# Patient Record
Sex: Male | Born: 1975 | Race: Black or African American | Hispanic: No | Smoking: Former smoker
Health system: Southern US, Community
[De-identification: ages and names within clinical notes are randomized; demographics above are authoritative.]

## PROBLEM LIST (undated history)

## (undated) DIAGNOSIS — I10 Essential (primary) hypertension: Secondary | ICD-10-CM

## (undated) DIAGNOSIS — F32A Depression, unspecified: Secondary | ICD-10-CM

## (undated) DIAGNOSIS — R454 Irritability and anger: Secondary | ICD-10-CM

## (undated) DIAGNOSIS — R4689 Other symptoms and signs involving appearance and behavior: Secondary | ICD-10-CM

## (undated) DIAGNOSIS — F4310 Post-traumatic stress disorder, unspecified: Secondary | ICD-10-CM

## (undated) DIAGNOSIS — R4589 Other symptoms and signs involving emotional state: Secondary | ICD-10-CM

## (undated) DIAGNOSIS — F329 Major depressive disorder, single episode, unspecified: Secondary | ICD-10-CM

## (undated) HISTORY — PX: EYE SURGERY: SHX253

---

## 2009-04-13 ENCOUNTER — Emergency Department (HOSPITAL_COMMUNITY): Admission: EM | Admit: 2009-04-13 | Discharge: 2009-04-13 | Payer: Self-pay | Admitting: Family Medicine

## 2011-05-26 ENCOUNTER — Emergency Department (HOSPITAL_COMMUNITY)
Admission: EM | Admit: 2011-05-26 | Discharge: 2011-05-27 | Disposition: A | Discharge status: Home / Self Care | Payer: Self-pay | Attending: Emergency Medicine | Admitting: Emergency Medicine

## 2011-05-26 DIAGNOSIS — M545 Low back pain, unspecified: Secondary | ICD-10-CM | POA: Insufficient documentation

## 2011-05-26 DIAGNOSIS — W1789XA Other fall from one level to another, initial encounter: Secondary | ICD-10-CM | POA: Insufficient documentation

## 2011-05-26 DIAGNOSIS — S335XXA Sprain of ligaments of lumbar spine, initial encounter: Secondary | ICD-10-CM | POA: Insufficient documentation

## 2011-05-26 DIAGNOSIS — R209 Unspecified disturbances of skin sensation: Secondary | ICD-10-CM | POA: Insufficient documentation

## 2011-05-27 ENCOUNTER — Emergency Department (HOSPITAL_COMMUNITY): Payer: Self-pay

## 2015-02-28 ENCOUNTER — Encounter (HOSPITAL_COMMUNITY): Payer: Self-pay | Admitting: *Deleted

## 2015-02-28 ENCOUNTER — Emergency Department (HOSPITAL_COMMUNITY)
Admission: EM | Admit: 2015-02-28 | Discharge: 2015-02-28 | Disposition: A | Discharge status: Home / Self Care | Payer: Self-pay | Attending: Emergency Medicine | Admitting: Emergency Medicine

## 2015-02-28 DIAGNOSIS — K0889 Other specified disorders of teeth and supporting structures: Secondary | ICD-10-CM

## 2015-02-28 DIAGNOSIS — K088 Other specified disorders of teeth and supporting structures: Secondary | ICD-10-CM | POA: Insufficient documentation

## 2015-02-28 DIAGNOSIS — R21 Rash and other nonspecific skin eruption: Secondary | ICD-10-CM | POA: Insufficient documentation

## 2015-02-28 DIAGNOSIS — Z720 Tobacco use: Secondary | ICD-10-CM | POA: Insufficient documentation

## 2015-02-28 DIAGNOSIS — I10 Essential (primary) hypertension: Secondary | ICD-10-CM | POA: Insufficient documentation

## 2015-02-28 DIAGNOSIS — K029 Dental caries, unspecified: Secondary | ICD-10-CM | POA: Insufficient documentation

## 2015-02-28 MED ORDER — ONDANSETRON 4 MG PO TBDP
4.0000 mg | ORAL_TABLET | Freq: Once | ORAL | Status: AC
  Administered 2015-02-28: 4 mg via ORAL
  Filled 2015-02-28: qty 1

## 2015-02-28 MED ORDER — AMOXICILLIN 500 MG PO CAPS
500.0000 mg | ORAL_CAPSULE | Freq: Once | ORAL | Status: AC
  Administered 2015-02-28: 500 mg via ORAL
  Filled 2015-02-28: qty 1

## 2015-02-28 MED ORDER — AMOXICILLIN 500 MG PO CAPS: 500.0000 mg | ORAL_CAPSULE | Freq: Three times a day (TID) | ORAL | Status: DC

## 2015-02-28 MED ORDER — OXYCODONE-ACETAMINOPHEN 5-325 MG PO TABS
1.0000 | ORAL_TABLET | Freq: Once | ORAL | Status: AC
  Administered 2015-02-28: 1 via ORAL
  Filled 2015-02-28: qty 1

## 2015-02-28 MED ORDER — TRAMADOL HCL 50 MG PO TABS: 50.0000 mg | ORAL_TABLET | Freq: Four times a day (QID) | ORAL | Status: DC | PRN

## 2015-02-28 MED ORDER — IBUPROFEN 400 MG PO TABS
800.0000 mg | ORAL_TABLET | Freq: Once | ORAL | Status: AC
  Administered 2015-02-28: 800 mg via ORAL
  Filled 2015-02-28: qty 2

## 2015-02-28 NOTE — ED Provider Notes (Signed)
CSN: [id number]     Arrival date & time 02/28/15  1738 History  This chart was scribed for Scott Pel, PA-C, working with Doug Sou, MD by Chestine Spore, ED Scribe. The patient was seen in room TR11C/TR11C at 7:32 PM.    Chief Complaint  Patient presents with  . Dental Pain      The history is provided by the patient. No language interpreter was used.    HPI Comments: Kagan Mutchler is a 39 y.o. male who presents to the Emergency Department complaining of intermittent right lower dental pain onset 2 days. Pt notes that there is a bitter acidic taste from the area. He states that he is having associated symptoms of migraine with blurred vision. He states that he has tried 7- ASA with relief for his symptoms. Pt will typically take 3 ASA that will aid in the relief of his symptoms. He denies fever, n/v/d, and any other symptoms. Pt is a cook.    History reviewed. No pertinent past medical history. History reviewed. No pertinent past surgical history. No family history on file. History  Substance Use Topics  . Smoking status: Current Every Day Smoker  . Smokeless tobacco: Not on file  . Alcohol Use: Yes    Review of Systems  Constitutional: Negative for fever.  HENT: Positive for dental problem.   Eyes:       Blurred vision  Gastrointestinal: Negative for nausea and vomiting.  Neurological: Positive for headaches.    Allergies  Review of patient's allergies indicates no known allergies.  Home Medications   Prior to Admission medications   Medication Sig Start Date End Date Taking? Authorizing Provider  amoxicillin (AMOXIL) 500 MG capsule Take 1 capsule (500 mg total) by mouth 3 (three) times daily. 02/28/15   Jozey Janco Neva Seat, PA-C  traMADol (ULTRAM) 50 MG tablet Take 1 tablet (50 mg total) by mouth every 6 (six) hours as needed. 02/28/15   Annaliz Aven Neva Seat, PA-C   BP 170/101 mmHg  Pulse 72  Temp(Src) 98.8 F (37.1 C) (Oral)  Resp 18  SpO2 98%  Physical Exam   Constitutional: He is oriented to person, place, and time. He appears well-developed and well-nourished. No distress.  HENT:  Head: Normocephalic and atraumatic.  Mouth/Throat: Oropharynx is clear and moist and mucous membranes are normal. No oral lesions. No trismus in the jaw. Dental caries present. No dental abscesses, uvula swelling or lacerations.    No tongue protrusion or elevation to floor of mouth. No tenderness to cervical tissue.  Eyes: Conjunctivae and EOM are normal. Pupils are equal, round, and reactive to light.  Neck: Normal range of motion. Neck supple. No tracheal deviation present.  Cardiovascular: Normal rate and regular rhythm.   Pulmonary/Chest: Effort normal and breath sounds normal. No respiratory distress.  Musculoskeletal: Normal range of motion.  Neurological: He is alert and oriented to person, place, and time.  Skin: Skin is warm and dry.  Psychiatric: He has a normal mood and affect. His behavior is normal.  Nursing note and vitals reviewed.   ED Course  Procedures (including critical care time) DIAGNOSTIC STUDIES: Oxygen Saturation is 98% on RA, normal by my interpretation.    COORDINATION OF CARE: 7:37 PM-Discussed treatment plan which includes abx Rx, ibuprofen Rx, vicodin Rx, referral to dentist with pt at bedside and pt agreed to plan.   Labs Review Labs Reviewed - No data to display  Imaging Review No results found.   EKG Interpretation None  MDM   Final diagnoses:  Toothache  Essential hypertension    No emergent s/sx's present. Patent airway. No trismus.  No neck tenderness or protrusion of tongue or floor of mouth.  Rx: Amoxicillin and Ultram Referral to Maxillofacial, tooth is broken.  38 y.o.Viviann SpareSteven Pitner's evaluation in the Emergency Department is complete. It has been determined that no acute conditions requiring further emergency intervention are present at this time. The patient/guardian have been advised of the  diagnosis and plan. We have discussed signs and symptoms that warrant return to the ED, such as changes or worsening in symptoms.  Vital signs are stable at discharge. Filed Vitals:   02/28/15 1751  BP: 170/101  Pulse: 72  Temp: 98.8 F (37.1 C)  Resp: 18    Patient/guardian has voiced understanding and agreed to follow-up with the PCP or specialist.   I personally performed the services described in this documentation, which was scribed in my presence. The recorded information has been reviewed and is accurate.    Scott Peliffany Bobbie Valletta, PA-C 02/28/15 1946  Doug SouSam Jacubowitz, MD 03/01/15 [id number]

## 2015-02-28 NOTE — ED Notes (Signed)
The pt is c/o a toothache  fior 2 days

## 2015-02-28 NOTE — Discharge Instructions (Signed)
Dental Pain °A tooth ache may be caused by cavities (tooth decay). Cavities expose the nerve of the tooth to air and hot or cold temperatures. It may come from an infection or abscess (also called a boil or furuncle) around your tooth. It is also often caused by dental caries (tooth decay). This causes the pain you are having. °DIAGNOSIS  °Your caregiver can diagnose this problem by exam. °TREATMENT  °· If caused by an infection, it may be treated with medications which kill germs (antibiotics) and pain medications as prescribed by your caregiver. Take medications as directed. °· Only take over-the-counter or prescription medicines for pain, discomfort, or fever as directed by your caregiver. °· Whether the tooth ache today is caused by infection or dental disease, you should see your dentist as soon as possible for further care. °SEEK MEDICAL CARE IF: °The exam and treatment you received today has been provided on an emergency basis only. This is not a substitute for complete medical or dental care. If your problem worsens or new problems (symptoms) appear, and you are unable to meet with your dentist, call or return to this location. °SEEK IMMEDIATE MEDICAL CARE IF:  °· You have a fever. °· You develop redness and swelling of your face, jaw, or neck. °· You are unable to open your mouth. °· You have severe pain uncontrolled by pain medicine. °MAKE SURE YOU:  °· Understand these instructions. °· Will watch your condition. °· Will get help right away if you are not doing well or get worse. °Document Released: 11/14/2005 Document Revised: 02/06/2012 Document Reviewed: 07/02/2008 °Exit[employer]® Patient Information ©2015 [employer]. This information is not intended to replace advice given to you by your health care provider. Make sure you discuss any questions you have with your health care provider. °Hypertension °Hypertension, commonly called high blood pressure, is when the force of blood pumping through your  arteries is too strong. Your arteries are the blood vessels that carry blood from your heart throughout your body. A blood pressure reading consists of a higher number over a lower number, such as 110/72. The higher number (systolic) is the pressure inside your arteries when your heart pumps. The lower number (diastolic) is the pressure inside your arteries when your heart relaxes. Ideally you want your blood pressure below 120/80. °Hypertension forces your heart to work harder to pump blood. Your arteries may become narrow or stiff. Having hypertension puts you at risk for heart disease, stroke, and other problems.  °RISK FACTORS °Some risk factors for high blood pressure are controllable. Others are not.  °Risk factors you cannot control include:  °· Race. You may be at higher risk if you are African American. °· Age. Risk increases with age. °· Gender. Men are at higher risk than women before age 45 years. After age 65, women are at higher risk than men. °Risk factors you can control include: °· Not getting enough exercise or physical activity. °· Being overweight. °· Getting too much fat, sugar, calories, or salt in your diet. °· Drinking too much alcohol. °SIGNS AND SYMPTOMS °Hypertension does not usually cause signs or symptoms. Extremely high blood pressure (hypertensive crisis) may cause headache, anxiety, shortness of breath, and nosebleed. °DIAGNOSIS  °To check if you have hypertension, your health care provider will measure your blood pressure while you are seated, with your arm held at the level of your heart. It should be measured at least twice using the same arm. Certain conditions can cause a difference in   blood pressure between your right and left arms. A blood pressure reading that is higher than normal on one occasion does not mean that you need treatment. If one blood pressure reading is high, ask your health care provider about having it checked again. °TREATMENT  °Treating high blood pressure  includes making lifestyle changes and possibly taking medicine. Living a healthy lifestyle can help lower high blood pressure. You may need to change some of your habits. °Lifestyle changes may include: °· Following the DASH diet. This diet is high in fruits, vegetables, and whole grains. It is low in salt, red meat, and added sugars. °· Getting at least 2½ hours of brisk physical activity every week. °· Losing weight if necessary. °· Not smoking. °· Limiting alcoholic beverages. °· Learning ways to reduce stress. ° If lifestyle changes are not enough to get your blood pressure under control, your health care provider may prescribe medicine. You may need to take more than one. Work closely with your health care provider to understand the risks and benefits. °HOME CARE INSTRUCTIONS °· Have your blood pressure rechecked as directed by your health care provider.   °· Take medicines only as directed by your health care provider. Follow the directions carefully. Blood pressure medicines must be taken as prescribed. The medicine does not work as well when you skip doses. Skipping doses also puts you at risk for problems.   °· Do not smoke.   °· Monitor your blood pressure at home as directed by your health care provider.  °SEEK MEDICAL CARE IF:  °· You think you are having a reaction to medicines taken. °· You have recurrent headaches or feel dizzy. °· You have swelling in your ankles. °· You have trouble with your vision. °SEEK IMMEDIATE MEDICAL CARE IF: °· You develop a severe headache or confusion. °· You have unusual weakness, numbness, or feel faint. °· You have severe chest or abdominal pain. °· You vomit repeatedly. °· You have trouble breathing. °MAKE SURE YOU:  °· Understand these instructions. °· Will watch your condition. °· Will get help right away if you are not doing well or get worse. °Document Released: 11/14/2005 Document Revised: 03/31/2014 Document Reviewed: 09/06/2013 °ExitCare® Patient Information  ©2015 [employer]. This information is not intended to replace advice given to you by your health care provider. Make sure you discuss any questions you have with your health care provider. ° °

## 2015-08-08 ENCOUNTER — Encounter (HOSPITAL_COMMUNITY): Payer: Self-pay | Admitting: Emergency Medicine

## 2015-08-08 ENCOUNTER — Emergency Department (HOSPITAL_COMMUNITY)
Admission: EM | Admit: 2015-08-08 | Discharge: 2015-08-08 | Disposition: A | Discharge status: Home / Self Care | Payer: Self-pay | Attending: Emergency Medicine | Admitting: Emergency Medicine

## 2015-08-08 DIAGNOSIS — X58XXXA Exposure to other specified factors, initial encounter: Secondary | ICD-10-CM | POA: Insufficient documentation

## 2015-08-08 DIAGNOSIS — Z792 Long term (current) use of antibiotics: Secondary | ICD-10-CM | POA: Insufficient documentation

## 2015-08-08 DIAGNOSIS — Y998 Other external cause status: Secondary | ICD-10-CM | POA: Insufficient documentation

## 2015-08-08 DIAGNOSIS — M545 Low back pain, unspecified: Secondary | ICD-10-CM

## 2015-08-08 DIAGNOSIS — S299XXA Unspecified injury of thorax, initial encounter: Secondary | ICD-10-CM | POA: Insufficient documentation

## 2015-08-08 DIAGNOSIS — Y9289 Other specified places as the place of occurrence of the external cause: Secondary | ICD-10-CM | POA: Insufficient documentation

## 2015-08-08 DIAGNOSIS — Y93B2 Activity, push-ups, pull-ups, sit-ups: Secondary | ICD-10-CM | POA: Insufficient documentation

## 2015-08-08 DIAGNOSIS — Z720 Tobacco use: Secondary | ICD-10-CM | POA: Insufficient documentation

## 2015-08-08 MED ORDER — IBUPROFEN 800 MG PO TABS: 800.0000 mg | ORAL_TABLET | Freq: Three times a day (TID) | ORAL | Status: DC | PRN

## 2015-08-08 MED ORDER — CYCLOBENZAPRINE HCL 10 MG PO TABS: 10.0000 mg | ORAL_TABLET | Freq: Three times a day (TID) | ORAL | Status: DC | PRN

## 2015-08-08 NOTE — ED Notes (Signed)
Pt. reports low back pain onset this morning  while " working out" , no urinary incontinence / denies hematuria or dysuria . Pain increases with movement and changing position. Ambulatory.

## 2015-08-08 NOTE — Discharge Instructions (Signed)
Read the information below.  Use the prescribed medication as directed.  Please discuss all new medications with your pharmacist.  You may return to the Emergency Department at any time for worsening condition or any new symptoms that concern you.   If you develop fevers, loss of control of bowel or bladder, weakness or numbness in your legs, or are unable to walk, return to the ER for a recheck.   Back Pain, Adult Low back pain is very common. About 1 in 5 people have back pain.The cause of low back pain is rarely dangerous. The pain often gets better over time.About half of people with a sudden onset of back pain feel better in just 2 weeks. About 8 in 10 people feel better by 6 weeks.  CAUSES Some common causes of back pain include:  Strain of the muscles or ligaments supporting the spine.  Wear and tear (degeneration) of the spinal discs.  Arthritis.  Direct injury to the back. DIAGNOSIS Most of the time, the direct cause of low back pain is not known.However, back pain can be treated effectively even when the exact cause of the pain is unknown.Answering your caregiver's questions about your overall health and symptoms is one of the most accurate ways to make sure the cause of your pain is not dangerous. If your caregiver needs more information, he or she may order lab work or imaging tests (X-rays or MRIs).However, even if imaging tests show changes in your back, this usually does not require surgery. HOME CARE INSTRUCTIONS For many people, back pain returns.Since low back pain is rarely dangerous, it is often a condition that people can learn to [facility] their own.   Remain active. It is stressful on the back to sit or stand in one place. Do not sit, drive, or stand in one place for more than 30 minutes at a time. Take short walks on level surfaces as soon as pain allows.Try to increase the length of time you walk each day.  Do not stay in bed.Resting more than 1 or 2 days can  delay your recovery.  Do not avoid exercise or work.Your body is made to move.It is not dangerous to be active, even though your back may hurt.Your back will likely heal faster if you return to being active before your pain is gone.  Pay attention to your body when you bend and lift. Many people have less discomfortwhen lifting if they bend their knees, keep the load close to their bodies,and avoid twisting. Often, the most comfortable positions are those that put less stress on your recovering back.  Find a comfortable position to sleep. Use a firm mattress and lie on your side with your knees slightly bent. If you lie on your back, put a pillow under your knees.  Only take over-the-counter or prescription medicines as directed by your caregiver. Over-the-counter medicines to reduce pain and inflammation are often the most helpful.Your caregiver may prescribe muscle relaxant drugs.These medicines help dull your pain so you can more quickly return to your normal activities and healthy exercise.  Put ice on the injured area.  Put ice in a plastic bag.  Place a towel between your skin and the bag.  Leave the ice on for 15-20 minutes, 03-04 times a day for the first 2 to 3 days. After that, ice and heat may be alternated to reduce pain and spasms.  Ask your caregiver about trying back exercises and gentle massage. This may be of some benefit.  Avoid feeling anxious or stressed.Stress increases muscle tension and can worsen back pain.It is important to recognize when you are anxious or stressed and learn ways to manage it.Exercise is a great option. SEEK MEDICAL CARE IF:  You have pain that is not relieved with rest or medicine.  You have pain that does not improve in 1 week.  You have new symptoms.  You are generally not feeling well. SEEK IMMEDIATE MEDICAL CARE IF:   You have pain that radiates from your back into your legs.  You develop new bowel or bladder control  problems.  You have unusual weakness or numbness in your arms or legs.  You develop nausea or vomiting.  You develop abdominal pain.  You feel faint. Document Released: 11/14/2005 Document Revised: 05/15/2012 Document Reviewed: 03/18/2014 [facility] Patient Information 2015 [place], [place]. This information is not intended to replace advice given to you by your health care provider. Make sure you discuss any questions you have with your health care provider.  Lumbosacral Strain Lumbosacral strain is a strain of any of the parts that make up your lumbosacral vertebrae. Your lumbosacral vertebrae are the bones that make up the lower third of your backbone. Your lumbosacral vertebrae are held together by muscles and tough, fibrous tissue (ligaments).  CAUSES  A sudden blow to your back can cause lumbosacral strain. Also, anything that causes an excessive stretch of the muscles in the low back can cause this strain. This is typically seen when people exert themselves strenuously, fall, lift heavy objects, bend, or crouch repeatedly. RISK FACTORS  Physically demanding work.  Participation in pushing or pulling sports or sports that require a sudden twist of the back (tennis, golf, baseball).  Weight lifting.  Excessive lower back curvature.  Forward-tilted pelvis.  Weak back or abdominal muscles or both.  Tight hamstrings. SIGNS AND SYMPTOMS  Lumbosacral strain may cause pain in the area of your injury or pain that moves (radiates) down your leg.  DIAGNOSIS Your health care provider can often diagnose lumbosacral strain through a physical exam. In some cases, you may need tests such as X-ray exams.  TREATMENT  Treatment for your lower back injury depends on many factors that your clinician will have to evaluate. However, most treatment will include the use of anti-inflammatory medicines. HOME CARE INSTRUCTIONS   Avoid hard physical activities (tennis, racquetball, waterskiing) if you  are not in proper physical condition for it. This may aggravate or create problems.  If you have a back problem, avoid sports requiring sudden body movements. Swimming and walking are generally safer activities.  Maintain good posture.  Maintain a healthy weight.  For acute conditions, you may put ice on the injured area.  Put ice in a plastic bag.  Place a towel between your skin and the bag.  Leave the ice on for 20 minutes, 2-3 times a day.  When the low back starts healing, stretching and strengthening exercises may be recommended. SEEK MEDICAL CARE IF:  Your back pain is getting worse.  You experience severe back pain not relieved with medicines. SEEK IMMEDIATE MEDICAL CARE IF:   You have numbness, tingling, weakness, or problems with the use of your arms or legs.  There is a change in bowel or bladder control.  You have increasing pain in any area of the body, including your belly (abdomen).  You notice shortness of breath, dizziness, or feel faint.  You feel sick to your stomach (nauseous), are throwing up (vomiting), or become sweaty.  You notice discoloration of your toes or legs, or your feet get very cold. MAKE SURE YOU:   Understand these instructions.  Will watch your condition.  Will get help right away if you are not doing well or get worse. Document Released: 08/24/2005 Document Revised: 11/19/2013 Document Reviewed: 07/03/2013 [facility] Patient Information 2015 [place], [place]. This information is not intended to replace advice given to you by your health care provider. Make sure you discuss any questions you have with your health care provider.

## 2015-08-08 NOTE — ED Provider Notes (Signed)
CSN: [id number]     Arrival date & time 08/08/15  1927 History   First MD Initiated Contact with Patient 08/08/15 2029     Chief Complaint  Patient presents with  . Back Injury     (Consider location/radiation/quality/duration/timing/severity/associated sxs/prior Treatment) HPI   Pt presents with low back pain that began this morning while exercising.  States he did an exercise that involves doing an push up and quickly jumping to a standing position and when he did this he felt a pop in his low back.  Denies any radiation of the pain.  The pain is worse with movement.  7/10 intensity.  Denies fevers, chills, abdominal pain, loss of control of bowel or bladder, weakness of numbness of the extremities, saddle anesthesia, bowel, urinary, or testicular complaints.  He has taken no medication for his pain.   History reviewed. No pertinent past medical history. History reviewed. No pertinent past surgical history. No family history on file. Social History  Substance Use Topics  . Smoking status: Current Every Day Smoker  . Smokeless tobacco: None  . Alcohol Use: Yes    Review of Systems  Constitutional: Negative for fever and chills.  Gastrointestinal: Negative for nausea, vomiting, abdominal pain and diarrhea.  Genitourinary: Negative for dysuria, urgency and frequency.  Musculoskeletal: Positive for back pain. Negative for neck stiffness.  Skin: Negative for rash and wound.  Allergic/Immunologic: Negative for immunocompromised state.  Neurological: Negative for weakness and numbness.  Hematological: Does not bruise/bleed easily.  Psychiatric/Behavioral: Negative for self-injury.      Allergies  Review of patient's allergies indicates no known allergies.  Home Medications   Prior to Admission medications   Medication Sig Start Date End Date Taking? Authorizing Provider  amoxicillin (AMOXIL) 500 MG capsule Take 1 capsule (500 mg total) by mouth 3 (three) times daily. 02/28/15    Tiffany Neva Seat, PA-C  traMADol (ULTRAM) 50 MG tablet Take 1 tablet (50 mg total) by mouth every 6 (six) hours as needed. 02/28/15   Tiffany Neva Seat, PA-C   BP 144/94 mmHg  Pulse 79  Temp(Src) 98.4 F (36.9 C) (Oral)  Resp 14  Ht  (1.778 m)  Wt 216 lb 1 oz (98.005 kg)  BMI 31.00 kg/m2  SpO2 98% Physical Exam  Constitutional: He appears well-developed and well-nourished. No distress.  HENT:  Head: Normocephalic and atraumatic.  Neck: Neck supple.  Pulmonary/Chest: Effort normal.  Abdominal: Soft. He exhibits no distension. There is no tenderness. There is no rebound and no guarding.  Musculoskeletal:       Back:  Spine nontender, no crepitus, or stepoffs. Lower extremities:  Strength 5/5, sensation intact, distal pulses intact.     Neurological: He is alert.  Skin: He is not diaphoretic.  Nursing note and vitals reviewed.   ED Course  Procedures (including critical care time) Labs Review Labs Reviewed - No data to display  Imaging Review No results found. I have personally reviewed and evaluated these images and lab results as part of my medical decision-making.   EKG Interpretation None      MDM   Final diagnoses:  Midline low back pain without sciatica    Afebrile, nontoxic patient with mechanical low back pain. No red flags.  D/C home with ibuprofen, flexeril, PCP resources, work note.  Discussed result, findings, treatment, and follow up  with patient.  Pt given return precautions.  Pt verbalizes understanding and agrees with plan.        Trixie Dredge, PA-C 08/08/15  2109  Tilden Fossa, MD 08/09/15 [phone]

## 2015-08-29 ENCOUNTER — Encounter (HOSPITAL_COMMUNITY): Payer: Self-pay | Admitting: *Deleted

## 2015-08-29 ENCOUNTER — Emergency Department (HOSPITAL_COMMUNITY)
Admission: EM | Admit: 2015-08-29 | Discharge: 2015-08-29 | Disposition: A | Discharge status: Home / Self Care | Payer: Self-pay | Attending: Emergency Medicine | Admitting: Emergency Medicine

## 2015-08-29 DIAGNOSIS — M545 Low back pain, unspecified: Secondary | ICD-10-CM

## 2015-08-29 DIAGNOSIS — Z720 Tobacco use: Secondary | ICD-10-CM | POA: Insufficient documentation

## 2015-08-29 DIAGNOSIS — Z792 Long term (current) use of antibiotics: Secondary | ICD-10-CM | POA: Insufficient documentation

## 2015-08-29 MED ORDER — CYCLOBENZAPRINE HCL 10 MG PO TABS: 10.0000 mg | ORAL_TABLET | Freq: Three times a day (TID) | ORAL | Status: DC | PRN

## 2015-08-29 MED ORDER — IBUPROFEN 800 MG PO TABS: 800.0000 mg | ORAL_TABLET | Freq: Three times a day (TID) | ORAL | Status: DC | PRN

## 2015-08-29 NOTE — ED Notes (Signed)
Declined W/C at D/C and was escorted to lobby by RN. 

## 2015-08-29 NOTE — ED Provider Notes (Signed)
CSN: [id number]     Arrival date & time 08/29/15  1620 History  By signing my name below, I, [name], attest that this documentation has been prepared under the direction and in the presence of Trisha Mangle, PA-C. Electronically Signed: [name] ED Scribe. 08/29/2015. 5:06 PM.    Chief Complaint  Patient presents with  . Back Pain   The history is provided by the patient. No language interpreter was used.    HPI Comments: Scott Fischer is a 39 y.o. male who presents to the Emergency Department complaining of constant, moderate, left > right lower back pain onset this morning.  The patient reports the pain onset after dead lifting 400 lbs.  He reports a prior identical episode of pain after doing the same lifting routine 1 month ago that has resolved.     History reviewed. No pertinent past medical history. History reviewed. No pertinent past surgical history. No family history on file. Social History  Substance Use Topics  . Smoking status: Current Every Day Smoker  . Smokeless tobacco: None  . Alcohol Use: Yes    Review of Systems A complete 10 system review of systems was obtained and all systems are negative except as noted in the HPI and PMH.   Allergies  Review of patient's allergies indicates no known allergies.  Home Medications   Prior to Admission medications   Medication Sig Start Date End Date Taking? Authorizing Provider  amoxicillin (AMOXIL) 500 MG capsule Take 1 capsule (500 mg total) by mouth 3 (three) times daily. 02/28/15   Tiffany Neva Seat, PA-C  cyclobenzaprine (FLEXERIL) 10 MG tablet Take 1 tablet (10 mg total) by mouth 3 (three) times daily as needed for muscle spasms. 08/08/15   Trixie Dredge, PA-C  ibuprofen (ADVIL,MOTRIN) 800 MG tablet Take 1 tablet (800 mg total) by mouth every 8 (eight) hours as needed for mild pain or moderate pain. 08/08/15   Trixie Dredge, PA-C  traMADol (ULTRAM) 50 MG tablet Take 1 tablet (50 mg total) by mouth every 6 (six)  hours as needed. 02/28/15   Tiffany Neva Seat, PA-C   BP 159/94 mmHg  Pulse 94  Temp(Src) 98.4 F (36.9 C) (Oral)  Resp 20  SpO2 96% Physical Exam  Constitutional: He is oriented to person, place, and time. He appears well-developed and well-nourished. No distress.  HENT:  Head: Normocephalic and atraumatic.  Eyes: Conjunctivae and EOM are normal.  Neck: Neck supple. No tracheal deviation present.  Cardiovascular: Normal rate.   Pulmonary/Chest: Effort normal. No respiratory distress.  Musculoskeletal: Normal range of motion.  Neurological: He is alert and oriented to person, place, and time.  Skin: Skin is warm and dry.  Psychiatric: He has a normal mood and affect. His behavior is normal.  Nursing note and vitals reviewed.   ED Course  Procedures (including critical care time)  DIAGNOSTIC STUDIES: Oxygen Saturation is 96% on RA, adequate by my interpretation.    COORDINATION OF CARE:  5:07 PM [patient should heat/ice, perform gentle stretches.  Will prescribe ibuprofen and muscle relaxant.  Patient acknowledges and agrees with plan.    Labs Review Labs Reviewed - No data to display  Imaging Review No results found.    EKG Interpretation None      MDM   Final diagnoses:  Left-sided low back pain without sciatica    Meds ordered this encounter  Medications  . cyclobenzaprine (FLEXERIL) 10 MG tablet    Sig: Take 1 tablet (10 mg total) by mouth  3 (three) times daily as needed for muscle spasms.    Dispense:  10 tablet    Refill:  0    Order Specific Question:  Supervising Provider    Answer:  Hyacinth Meeker, BRIAN [[id number]]  . ibuprofen (ADVIL,MOTRIN) 800 MG tablet    Sig: Take 1 tablet (800 mg total) by mouth every 8 (eight) hours as needed for mild pain or moderate pain.    Dispense:  15 tablet    Refill:  0    Order Specific Question:  Supervising Provider    Answer:  Eber Hong [[id number]]    I personally performed the services in this documentation, which was  scribed in my presence.  The recorded information has been reviewed and considered.   Barnet Pall.  Lonia Skinner [place], PA-C 08/29/15 2014  Lavera Guise, MD 10/07/15 [phone]

## 2015-08-29 NOTE — ED Notes (Signed)
The pt is c/o of lower back  After he was lifting weights one hour ago  He  Was lifting 400lbs and felt something pop in his back.  Pain since then.  He has had back problems in the past

## 2015-08-29 NOTE — Discharge Instructions (Signed)
Back Pain, Adult Low back pain is very common. About 1 in 5 people have back pain.The cause of low back pain is rarely dangerous. The pain often gets better over time.About half of people with a sudden onset of back pain feel better in just 2 weeks. About 8 in 10 people feel better by 6 weeks.  CAUSES Some common causes of back pain include:  Strain of the muscles or ligaments supporting the spine.  Wear and tear (degeneration) of the spinal discs.  Arthritis.  Direct injury to the back. DIAGNOSIS Most of the time, the direct cause of low back pain is not known.However, back pain can be treated effectively even when the exact cause of the pain is unknown.Answering your caregiver's questions about your overall health and symptoms is one of the most accurate ways to make sure the cause of your pain is not dangerous. If your caregiver needs more information, he or she may order lab work or imaging tests (X-rays or MRIs).However, even if imaging tests show changes in your back, this usually does not require surgery. HOME CARE INSTRUCTIONS For many people, back pain returns.Since low back pain is rarely dangerous, it is often a condition that people can learn to manageon their own.   Remain active. It is stressful on the back to sit or stand in one place. Do not sit, drive, or stand in one place for more than 30 minutes at a time. Take short walks on level surfaces as soon as pain allows.Try to increase the length of time you walk each day.  Do not stay in bed.Resting more than 1 or 2 days can delay your recovery.  Do not avoid exercise or work.Your body is made to move.It is not dangerous to be active, even though your back may hurt.Your back will likely heal faster if you return to being active before your pain is gone.  Pay attention to your body when you bend and lift. Many people have less discomfortwhen lifting if they bend their knees, keep the load close to their bodies,and  avoid twisting. Often, the most comfortable positions are those that put less stress on your recovering back.  Find a comfortable position to sleep. Use a firm mattress and lie on your side with your knees slightly bent. If you lie on your back, put a pillow under your knees.  Only take over-the-counter or prescription medicines as directed by your caregiver. Over-the-counter medicines to reduce pain and inflammation are often the most helpful.Your caregiver may prescribe muscle relaxant drugs.These medicines help dull your pain so you can more quickly return to your normal activities and healthy exercise.  Put ice on the injured area.  Put ice in a plastic bag.  Place a towel between your skin and the bag.  Leave the ice on for 15-20 minutes, 03-04 times a day for the first 2 to 3 days. After that, ice and heat may be alternated to reduce pain and spasms.  Ask your caregiver about trying back exercises and gentle massage. This may be of some benefit.  Avoid feeling anxious or stressed.Stress increases muscle tension and can worsen back pain.It is important to recognize when you are anxious or stressed and learn ways to manage it.Exercise is a great option. SEEK MEDICAL CARE IF:  You have pain that is not relieved with rest or medicine.  You have pain that does not improve in 1 week.  You have new symptoms.  You are generally not feeling well. SEEK   IMMEDIATE MEDICAL CARE IF:   You have pain that radiates from your back into your legs.  You develop new bowel or bladder control problems.  You have unusual weakness or numbness in your arms or legs.  You develop nausea or vomiting.  You develop abdominal pain.  You feel faint. Document Released: 11/14/2005 Document Revised: 05/15/2012 Document Reviewed: 03/18/2014 ExitCare Patient Information 2015 [employer]. This information is not intended to replace advice given to you by your health care provider. Make sure you  discuss any questions you have with your health care provider.  

## 2015-11-21 ENCOUNTER — Emergency Department (HOSPITAL_COMMUNITY)
Admission: EM | Admit: 2015-11-21 | Discharge: 2015-11-21 | Disposition: A | Discharge status: Home / Self Care | Payer: Self-pay | Attending: Emergency Medicine | Admitting: Emergency Medicine

## 2015-11-21 ENCOUNTER — Encounter (HOSPITAL_COMMUNITY): Payer: Self-pay | Admitting: Emergency Medicine

## 2015-11-21 ENCOUNTER — Emergency Department (HOSPITAL_COMMUNITY): Payer: Self-pay

## 2015-11-21 DIAGNOSIS — R0602 Shortness of breath: Secondary | ICD-10-CM | POA: Insufficient documentation

## 2015-11-21 DIAGNOSIS — R058 Other specified cough: Secondary | ICD-10-CM

## 2015-11-21 DIAGNOSIS — R05 Cough: Secondary | ICD-10-CM | POA: Insufficient documentation

## 2015-11-21 DIAGNOSIS — R0981 Nasal congestion: Secondary | ICD-10-CM | POA: Insufficient documentation

## 2015-11-21 DIAGNOSIS — F17200 Nicotine dependence, unspecified, uncomplicated: Secondary | ICD-10-CM | POA: Insufficient documentation

## 2015-11-21 DIAGNOSIS — R079 Chest pain, unspecified: Secondary | ICD-10-CM | POA: Insufficient documentation

## 2015-11-21 LAB — CBC
HEMATOCRIT: 40.6 % (ref 39.0–52.0)
HEMOGLOBIN: 13.8 g/dL (ref 13.0–17.0)
MCH: 31.9 pg (ref 26.0–34.0)
MCHC: 34 g/dL (ref 30.0–36.0)
MCV: 94 fL (ref 78.0–100.0)
Platelets: 259 10*3/uL (ref 150–400)
RBC: 4.32 MIL/uL (ref 4.22–5.81)
RDW: 13.1 % (ref 11.5–15.5)
WBC: 5 10*3/uL (ref 4.0–10.5)

## 2015-11-21 LAB — D-DIMER, QUANTITATIVE: D-Dimer, Quant: 0.33 ug/mL-FEU (ref 0.00–0.50)

## 2015-11-21 LAB — BASIC METABOLIC PANEL
ANION GAP: 11 (ref 5–15)
BUN: 14 mg/dL (ref 6–20)
CO2: 26 mmol/L (ref 22–32)
Calcium: 8.3 mg/dL — ABNORMAL LOW (ref 8.9–10.3)
Chloride: 107 mmol/L (ref 101–111)
Creatinine, Ser: 1.19 mg/dL (ref 0.61–1.24)
GFR calc Af Amer: >60 mL/min (ref >60)
GFR calc non Af Amer: >60 mL/min (ref >60)
GLUCOSE: 126 mg/dL — AB (ref 65–99)
POTASSIUM: 3.2 mmol/L — AB (ref 3.5–5.1)
Sodium: 144 mmol/L (ref 135–145)

## 2015-11-21 LAB — I-STAT TROPONIN, ED: Troponin i, poc: 0.01 ng/mL (ref 0.00–0.08)

## 2015-11-21 MED ORDER — BENZONATATE 100 MG PO CAPS: 100.0000 mg | ORAL_CAPSULE | Freq: Three times a day (TID) | ORAL | Status: DC | PRN

## 2015-11-21 MED ORDER — KETOROLAC TROMETHAMINE 60 MG/2ML IM SOLN: 60.0000 mg | Freq: Once | INTRAMUSCULAR | Status: DC

## 2015-11-21 MED ORDER — POTASSIUM CHLORIDE CRYS ER 20 MEQ PO TBCR
40.0000 meq | EXTENDED_RELEASE_TABLET | Freq: Once | ORAL | Status: AC
  Administered 2015-11-21: 40 meq via ORAL
  Filled 2015-11-21: qty 2

## 2015-11-21 MED ORDER — IBUPROFEN 800 MG PO TABS: 800.0000 mg | ORAL_TABLET | Freq: Three times a day (TID) | ORAL | Status: DC | PRN

## 2015-11-21 NOTE — ED Notes (Signed)
Per pt, states central chest pain for 2 days-states he has been having cold symptoms as well

## 2015-11-21 NOTE — Discharge Instructions (Signed)
°Emergency Department Resource Guide °1) Find a Doctor and Pay Out of Pocket °Although you won't have to find out who is covered by your insurance plan, it is a good idea to ask around and get recommendations. You will then need to call the office and see if the doctor you have chosen will accept you as a new patient and what types of options they offer for patients who are self-pay. Some doctors offer discounts or will set up payment plans for their patients who do not have insurance, but you will need to ask so you aren't surprised when you get to your appointment. ° °2) Contact Your Local Health Department °Not all health departments have doctors that can see patients for sick visits, but many do, so it is worth a call to see if yours does. If you don't know where your local health department is, you can check in your phone book. The CDC also has a tool to help you locate your state's health department, and many state websites also have listings of all of their local health departments. ° °3) Find a Walk-in Clinic °If your illness is not likely to be very severe or complicated, you may want to try a walk in clinic. These are popping up all over the country in pharmacies, drugstores, and shopping centers. They're usually staffed by nurse practitioners or physician assistants that have been trained to treat common illnesses and complaints. They're usually fairly quick and inexpensive. However, if you have serious medical issues or chronic medical problems, these are probably not your best option. ° °No Primary Care Doctor: °- Call [facility] at  832-8000 - they can help you locate a primary care doctor that  accepts your insurance, provides certain services, etc. °- Physician Referral Service- 1-800-533-3463 ° °Chronic Pain Problems: °Organization         Address  Phone   Notes  °[facility]  (336) 297-2271 Patients need to be referred by their primary care doctor.  ° °Medication  Assistance: °Organization         Address  Phone   Notes  °[place] [facility] [address] °[place], [place] [address] (336) 641-8030 --Must be a resident of [place] °-- Must have NO insurance coverage whatsoever (no Medicaid/ Medicare, etc.) °-- The pt. MUST have a primary care doctor that directs their care regularly and follows them in the community °  °[facility]  (866) 331-1348   °[employer]  (888) 892-1162   ° °Agencies that provide inexpensive medical care: °Organization         Address  Phone   Notes  °[facility]  (336) 832-8035   °[facility]    (336) 832-7272   °[facility] [address] °[place], [place] [address] (336) 832-4777   °[facility] [address], °[place] (336) 271-4999   °[facility]    (336) 373-0678   °[facility]    (336) 272-1050   °[facility] ° [address], [place] Phone:  (336) 832-4444, Fax:  (336) 832-4440 Hours of Operation:  9 am - 6 pm, M-F.  Also accepts Medicaid/Medicare and self-pay.  °[facility] ° [address], [place] Phone: (336) 832-3150, Fax: (336) [phone]. Hours of Operation:  8:30 am - 5:30 pm, M-F.  Also accepts Medicaid and self-pay.  °[facility] [place] [address], [place] Phone: (336) 878-6027   °[facility] [address], [place], [place] (336)723-1848, Ext. 123 Mondays & Thursdays: 7-9 AM.  First 15 patients are seen on a first come, first serve basis. °  ° °Medicaid-accepting [place] Providers: ° °Organization         Address  Phone   Notes  °[facility] [address], [place] (336) 641-2100 Also accepts self-pay patients.  °[facility] [address], [place] ° (336) 856-9996   °[facility] [address], [place]  (336) 288-8857   °[facility] [address] [place] [address], [place] (336) 299-7000   °Veita Bland [address], [place]  ° (336) 373-1557 Only accepts [place] Access Medicaid patients after they have their name applied to their card.  ° °Self-Pay (no insurance) in [place]: ° °Organization         Address  Phone   Notes  °Sickle Cell Patients, [facility] [address], [place] (336) 832-1970   °[facility] [address], [place] (336) 832-4400   °[facility] ° [address], [place] (336) 992-4800   °[facility]/Dr. Osei-Bonsu ° [address] [place] [address], [place] or [address], [place] (336) 841-8500 Phone number for both [place] and [place] locations is the same.  °[facility] [address], [place] (336) 299-0000   °[facility] [address] [place] [address], [place] or [address] (336) 852-7530 °(336) 878-2260   °[facility] [address], [place] (336) 350-1642, phone; (336) 294-5005, fax Sees patients 1st and 3rd Saturday of every month.  Must not qualify for public or private insurance (i.e. Medicaid, Medicare, [place] Health Choice, Veterans' Benefits) • Household income should be no more than 200% of the poverty level •The clinic cannot treat you if you are pregnant or think you are pregnant • Sexually transmitted diseases are not treated at the clinic.  ° ° °Dental Care: °Organization         Address  Phone  Notes  °[place] [facility] [address], [place] (336) 641-6152 Accepts children up to age 21 who are enrolled in Medicaid or [place] Health Choice; pregnant women with a Medicaid card; and children who have applied for Medicaid or [place] Health Choice, but were declined, whose parents can pay a reduced fee at time of service.  °[place]  [facility] [place]  [address], [place] (336) 641-7733 Accepts children up to age 21 who are enrolled in Medicaid or [place] Health Choice; pregnant women with a Medicaid card; and children who have applied for Medicaid or [place] Health Choice, but were declined, whose parents can pay a reduced fee at time of service.  °[facility] ° [address], [place] (336) 641-4533 Patients are seen by appointment only. Walk-ins are not accepted. [facility] will see patients 18 years of age and older. °Monday - Tuesday (8am-5pm) °Most Wednesdays (8:30-5pm) °$30 per visit, cash only  °[facility] ° [address], [place] (336) 641-4533 Patients are seen by appointment only. Walk-ins are not accepted. [facility] will see patients 18 years of age and older. °One   Wednesday Evening (Monthly: Volunteer Based).  $30 per visit, cash only  °[facility]  (919) 537-3737 for adults; Children under age 4, call Graduate Pediatric Dentistry at (919) [phone]. Children aged 4-14, please call (919) [phone] to request a pediatric application. ° Dental services are provided in all areas of dental care including fillings, crowns and bridges, complete and partial dentures, implants, gum treatment, root canals, and extractions. Preventive care is also provided. Treatment is provided to both adults and children. °Patients are selected via a lottery and there is often a waiting list. °  °[facility] [address], °[place] ° (336) 763-8833 [email or web address] °  °[facility] [address], [place], [place] (336)723-1848, Ext. 123 Second and Fourth Thursday of each month, opens at 6:30 AM; Clinic ends at 9 AM.  Patients are seen on a first-come first-served basis, and a limited number are seen during each clinic.  ° °[facility] ° [address], [place], [place] (336) 723-7904    Eligibility Requirements °You must have lived in [place], [place], or [place] counties for at least the last three months. °  You cannot be eligible for state or federal sponsored healthcare insurance, including Veterans Administration, Medicaid, or Medicare. °  You generally cannot be eligible for healthcare insurance through your employer.  °  How to apply: °Eligibility screenings are held every Tuesday and Wednesday afternoon from 1:00 pm until 4:00 pm. You do not need an appointment for the interview!  °[facility] [address], [place], [place] 336-631-2330   °[facility] [facility]  336-342-8273   °[facility]  336-703-3100   °[facility]  336-570-6415   ° °Behavioral Health Resources in the Community: °Intensive Outpatient Programs °Organization         Address  Phone  Notes  °[facility] [address], [place], [place] 336-878-6098   °[facility] [address], [place], [place] 336-832-9800   °[facility] [address], [place], [place] ° 336-882-2125   °[facility] [address],  °[place], [place] 1-800-853-5163 or 336-641-4981   °Substance Abuse Resources °Organization         Address  Phone  Notes  °[facility]  336-882-2125   °[facility]  336-784-9470   °[facility]  336-285-9073   °[facility]  336-845-3988   °Residential & Outpatient Substance Abuse Program  1-800-659-3381   °Psychological Services °Organization         Address  Phone  Notes  °[facility]  336- 832-9600   °[facility]  336- 378-7881   °[facility] [address], [place] 1-800-853-5163 or 336-641-4981   ° °Mobile Crisis Teams °Organization         Address  Phone  Notes  °[facility], Mobile Crisis Care Unit  1-877-626-1772   °Assertive °[facility] ° [address]  [place], [place] 336-834-9664   °Sharon DeEsch [address] °[place] 336-554-5454   ° °Self-Help/Support Groups °Organization         Address  Phone             Notes  °[facility]  - variety of support groups  336- 373-1402 Call for more information  °Narcotics Anonymous (NA), [facility] [address], °[place]  2 meetings at this location  ° °  Residential Treatment Programs °Organization         Address  Phone  Notes  °[facility] [address],    °[place]  1-866-801-8205   °[facility] ° [address], [place], [place] 704-293-8524   °[facility] [address], [place] 336-845-3988 Admissions: 8am-3pm M-F  °[facility] [address]    °[place], [place] 336-841-1104   °[facility] [address], [place], [place] 336-379-7146   °[facility] [address]  °[place], [place] 336-285-9073   °[facility] Intensive Outpatient [address], [place], [place] 336-852-3033   °[facility] [address]  °[place], [place] 1-877-615-2722 or 336-784-9470   °[facility] ([facility] [address] [place], [place] 336-227-7417 Accepts Medicaid  °[facility] [address]  °[place] 1-800-659-3381 Substance Abuse/Addiction Treatment  ° °[place] Behavioral Health Resources °Organization         Address  Phone  Notes  °[facility]  (888) 581-9988   °Julie Brannon, PhD [address] [place], [place]   (336) 349-5553 or (336) 951-0000   °[facility]   [address] °[place], [place] (336) 349-4454   °[facility] [address], [place], [place] (336) 342-8316 Insurance/Medicaid/sponsorship through [facility]  °[facility] [address]                                    [place], [place] (336) 342-8316 Therapy/tele-psych/case    °[facility] [address]  ° [place], [place] (336) 349-2233    °Dr. Arfeen  (336) 349-4544   °[facility] [place]  [employer] [place] [facility] 1) [address],  °2) [address], [place] °3)  [address], [place] (336) 349-3220 °(336) 342-7768 ° °(336) 342-8140   °[place] [facility] (336) 342-1394 or (336) 342-3537 (After Hours)    ° ° °

## 2015-11-21 NOTE — ED Notes (Signed)
Pt reported occ productive cough of greenish and yellowish sputum. ABC's intact.

## 2015-11-21 NOTE — ED Provider Notes (Signed)
CSN: [id number]     Arrival date & time 11/21/15  1618 History   First MD Initiated Contact with Patient 11/21/15 1843     Chief Complaint  Patient presents with  . Chest Pain     (Consider location/radiation/quality/duration/timing/severity/associated sxs/prior Treatment) HPI  39 year old male presents with chest pain for 2 days. Patient has been having a cough with yellow sputum for the last 5 days. Originally has some congestion but this seems to have resolved. No fevers. Developed chest pain that is coming and going 2 days ago and seems to get worse with coughing or inspiration. Is in the middle of his chest. The pain is a sharp pain. He has not taken anything for the pain. Denies a leg pain, leg swelling, or history of DVT/PE. Rates his pain as moderate at this time. No exertional pain.  History reviewed. No pertinent past medical history. History reviewed. No pertinent past surgical history. No family history on file. Social History  Substance Use Topics  . Smoking status: Current Every Day Smoker  . Smokeless tobacco: None  . Alcohol Use: Yes    Review of Systems  Constitutional: Negative for fever.  HENT: Positive for congestion.   Respiratory: Positive for cough and shortness of breath.   Cardiovascular: Positive for chest pain. Negative for leg swelling.  All other systems reviewed and are negative.     Allergies  Review of patient's allergies indicates no known allergies.  Home Medications   Prior to Admission medications   Medication Sig Start Date End Date Taking? Authorizing Provider  ibuprofen (ADVIL,MOTRIN) 200 MG tablet Take 200 mg by mouth every 6 (six) hours as needed for headache, mild pain or moderate pain.   Yes Historical Provider, MD  amoxicillin (AMOXIL) 500 MG capsule Take 1 capsule (500 mg total) by mouth 3 (three) times daily. Patient not taking: Reported on 11/21/2015 02/28/15   Scott Peliffany Greene, PA-C  cyclobenzaprine (FLEXERIL) 10 MG tablet Take 1  tablet (10 mg total) by mouth 3 (three) times daily as needed for muscle spasms. Patient not taking: Reported on 11/21/2015 08/29/15   [name], PA-C  ibuprofen (ADVIL,MOTRIN) 800 MG tablet Take 1 tablet (800 mg total) by mouth every 8 (eight) hours as needed for mild pain or moderate pain. Patient not taking: Reported on 11/21/2015 08/29/15   [name], PA-C  traMADol (ULTRAM) 50 MG tablet Take 1 tablet (50 mg total) by mouth every 6 (six) hours as needed. Patient not taking: Reported on 11/21/2015 02/28/15   Scott Peliffany Greene, PA-C   BP 130/78 mmHg  Pulse 102  Temp(Src) 98.9 F (37.2 C) (Oral)  Resp 20  SpO2 96% Physical Exam  Constitutional: He is oriented to person, place, and time. He appears well-developed and well-nourished.  HENT:  Head: Normocephalic and atraumatic.  Right Ear: External ear normal.  Left Ear: External ear normal.  Nose: Nose normal.  Eyes: Right eye exhibits no discharge. Left eye exhibits no discharge.  Neck: Neck supple.  Cardiovascular: Normal rate, regular rhythm, normal heart sounds and intact distal pulses.   Pulmonary/Chest: Effort normal and breath sounds normal. He has no wheezes. He has no rales. He exhibits no tenderness.  Abdominal: Soft. There is no tenderness.  Musculoskeletal: He exhibits no edema.  Neurological: He is alert and oriented to person, place, and time.  Skin: Skin is warm and dry.  Nursing note and vitals reviewed.   ED Course  Procedures (including critical care time) Labs Review Labs Reviewed  BASIC METABOLIC PANEL - Abnormal; Notable for the following:    Potassium 3.2 (*)    Glucose, Bld 126 (*)    Calcium 8.3 (*)    All other components within normal limits  CBC  D-DIMER, QUANTITATIVE (NOT AT [facility])  Rosezena Sensor, ED    Imaging Review Dg Chest 2 View  11/21/2015  CLINICAL DATA:  Midline anterior chest pain for 3 days EXAM: CHEST  2 VIEW COMPARISON:  None. FINDINGS: Cardiomediastinal silhouette is  unremarkable. No acute infiltrate or pleural effusion. No pulmonary edema. Bony thorax is unremarkable. IMPRESSION: No active cardiopulmonary disease. Electronically Signed   By: Natasha Mead M.D.   On: 11/21/2015 17:42   I have personally reviewed and evaluated these images and lab results as part of my medical decision-making.   EKG Interpretation   Date/Time:  Saturday November 21 2015 16:37:47 EST Ventricular Rate:  98 PR Interval:  174 QRS Duration: 91 QT Interval:  335 QTC Calculation: 428 R Axis:   57 Text Interpretation:  Sinus rhythm Probable left atrial enlargement  Borderline T wave abnormalities No old tracing to compare Confirmed by  Davon Abdelaziz  MD, Donnisha Besecker ([id number]) on 11/21/2015 6:43:59 PM      MDM   Final diagnoses:  Chest pain, unspecified chest pain type  Productive cough    Patient symptoms are most likely from a muscle strain from repeated coughing. I have counseled him on stopping smoking and will give him antitussives. Chest pain is unlikely to be ACS. He does have borderline T waves but no frank abnormalities such as inversions or any ST changes. Symptoms of been ongoing for 2 days and thus I think ACS is unlikely with a negative troponin. I discussed a second troponin the patient states he needs to leave right now does not want to wait. Offered pain treatment but patient does not want this either, will discharge with ibuprofen and antitussives. Low risk for PE, ddimer negative. Advised to find a PCP for f/u, discussed strict return precautions.    [name], MD 11/21/15 [phone]

## 2015-12-23 ENCOUNTER — Emergency Department (HOSPITAL_COMMUNITY)
Admission: EM | Admit: 2015-12-23 | Discharge: 2015-12-23 | Disposition: A | Discharge status: Home / Self Care | Payer: Medicaid Other | Attending: Emergency Medicine | Admitting: Emergency Medicine

## 2015-12-23 ENCOUNTER — Encounter (HOSPITAL_COMMUNITY): Payer: Self-pay | Admitting: *Deleted

## 2015-12-23 ENCOUNTER — Emergency Department (HOSPITAL_COMMUNITY): Payer: Medicaid Other

## 2015-12-23 DIAGNOSIS — F17200 Nicotine dependence, unspecified, uncomplicated: Secondary | ICD-10-CM | POA: Insufficient documentation

## 2015-12-23 DIAGNOSIS — M25512 Pain in left shoulder: Secondary | ICD-10-CM

## 2015-12-23 DIAGNOSIS — Y9389 Activity, other specified: Secondary | ICD-10-CM | POA: Insufficient documentation

## 2015-12-23 DIAGNOSIS — Y998 Other external cause status: Secondary | ICD-10-CM | POA: Insufficient documentation

## 2015-12-23 DIAGNOSIS — S199XXA Unspecified injury of neck, initial encounter: Secondary | ICD-10-CM | POA: Insufficient documentation

## 2015-12-23 DIAGNOSIS — Y9289 Other specified places as the place of occurrence of the external cause: Secondary | ICD-10-CM | POA: Insufficient documentation

## 2015-12-23 DIAGNOSIS — W108XXA Fall (on) (from) other stairs and steps, initial encounter: Secondary | ICD-10-CM | POA: Insufficient documentation

## 2015-12-23 DIAGNOSIS — S40012A Contusion of left shoulder, initial encounter: Secondary | ICD-10-CM

## 2015-12-23 MED ORDER — NAPROXEN 500 MG PO TABS: 500.0000 mg | ORAL_TABLET | Freq: Two times a day (BID) | ORAL | Status: DC | PRN

## 2015-12-23 NOTE — Discharge Instructions (Signed)
Wear shoulder sling for no more than 3 days, then begin performing gentle range of motion exercises. Ice your shoulder throughout the day, using an ice pack for 20 minutes at a time every hour. Alternate between naprosyn and tylenol for pain relief. Call orthopedic follow up today or tomorrow to schedule followup appointment for recheck of ongoing shoulder pain in 1-2 weeks that can be canceled with a 24-48 hour notice if complete resolution of pain. Return to the ER for changes or worsening symptoms.    Shoulder Pain The shoulder is the joint that connects your arms to your body. The bones that form the shoulder joint include the upper arm bone (humerus), the shoulder blade (scapula), and the collarbone (clavicle). The top of the humerus is shaped like a ball and fits into a rather flat socket on the scapula (glenoid cavity). A combination of muscles and strong, fibrous tissues that connect muscles to bones (tendons) support your shoulder joint and hold the ball in the socket. Small, fluid-filled sacs (bursae) are located in different areas of the joint. They act as cushions between the bones and the overlying soft tissues and help reduce friction between the gliding tendons and the bone as you move your arm. Your shoulder joint allows a wide range of motion in your arm. This range of motion allows you to do things like scratch your back or throw a ball. However, this range of motion also makes your shoulder more prone to pain from overuse and injury. Causes of shoulder pain can originate from both injury and overuse and usually can be grouped in the following four categories:  Redness, swelling, and pain (inflammation) of the tendon (tendinitis) or the bursae (bursitis).  Instability, such as a dislocation of the joint.  Inflammation of the joint (arthritis).  Broken bone (fracture). HOME CARE INSTRUCTIONS   Apply ice to the sore area.  Put ice in a plastic bag.  Place a towel between your skin  and the bag.  Leave the ice on for 15-20 minutes, 3-4 times per day for the first 2 days, or as directed by your health care provider.  Stop using cold packs if they do not help with the pain.  If you have a shoulder sling or immobilizer, wear it as long as your caregiver instructs. Only remove it to shower or bathe. Move your arm as little as possible, but keep your hand moving to prevent swelling.  Squeeze a soft ball or foam pad as much as possible to help prevent swelling.  Only take over-the-counter or prescription medicines for pain, discomfort, or fever as directed by your caregiver. SEEK MEDICAL CARE IF:   Your shoulder pain increases, or new pain develops in your arm, hand, or fingers.  Your hand or fingers become cold and numb.  Your pain is not relieved with medicines. SEEK IMMEDIATE MEDICAL CARE IF:   Your arm, hand, or fingers are numb or tingling.  Your arm, hand, or fingers are significantly swollen or turn white or blue. MAKE SURE YOU:   Understand these instructions.  Will watch your condition.  Will get help right away if you are not doing well or get worse.   This information is not intended to replace advice given to you by your health care provider. Make sure you discuss any questions you have with your health care provider.   Document Released: 08/24/2005 Document Revised: 12/05/2014 Document Reviewed: 03/09/2015 Elsevier Interactive Patient Education 2016 [employer].  Shoulder Range of Motion Exercises  Shoulder range of motion (ROM) exercises are designed to keep the shoulder moving freely. They are often recommended for people who have shoulder pain. MOVEMENT EXERCISE When you are able, do this exercise 5-6 days per week, or as told by your health care provider. Work toward doing 2 sets of 10 swings. Pendulum Exercise How To Do This Exercise Lying Down  Lie face-down on a bed with your abdomen close to the side of the bed.  Let your arm hang  over the side of the bed.  Relax your shoulder, arm, and hand.  Slowly and gently swing your arm forward and back. Do not use your neck muscles to swing your arm. They should be relaxed. If you are struggling to swing your arm, have someone gently swing it for you. When you do this exercise for the first time, swing your arm at a 15 degree angle for 15 seconds, or swing your arm 10 times. As pain lessens over time, increase the angle of the swing to 30-45 degrees.  Repeat steps 1-4 with the other arm. How To Do This Exercise While Standing  Stand next to a sturdy chair or table and hold on to it with your hand.  Bend forward at the waist.  Bend your knees slightly.  Relax your other arm and let it hang limp.  Relax the shoulder blade of the arm that is hanging and let it drop.  While keeping your shoulder relaxed, use body motion to swing your arm in small circles. The first time you do this exercise, swing your arm for about 30 seconds or 10 times. When you do it next time, swing your arm for a little longer.  Stand up tall and relax.  Repeat steps 1-7, this time changing the direction of the circles.  Repeat steps 1-8 with the other arm. STRETCHING EXERCISES Do these exercises 3-4 times per day on 5-6 days per week or as told by your health care provider. Work toward holding the stretch for 20 seconds. Stretching Exercise 1  Lift your arm straight out in front of you.  Bend your arm 90 degrees at the elbow (right angle) so your forearm goes across your body and looks like the letter "L."  Use your other arm to gently pull the elbow forward and across your body.  Repeat steps 1-3 with the other arm. Stretching Exercise 2 You will need a towel or rope for this exercise.  Bend one arm behind your back with the palm facing outward.  Hold a towel with your other hand.  Reach the arm that holds the towel above your head, and bend that arm at the elbow. Your wrist should be  behind your neck.  Use your free hand to grab the free end of the towel.  With the higher hand, gently pull the towel up behind you.  With the lower hand, pull the towel down behind you.  Repeat steps 1-6 with the other arm. STRENGTHENING EXERCISES Do each of these exercises at four different times of day (sessions) every day or as told by your health care provider. To begin with, repeat each exercise 5 times (repetitions). Work toward doing 3 sets of 12 repetitions or as told by your health care provider. Strengthening Exercise 1 You will need a light weight for this activity. As you grow stronger, you may use a heavier weight.  Standing with a weight in your hand, lift your arm straight out to the side until it is at  the same height as your shoulder.  Bend your arm at 90 degrees so that your fingers are pointing to the ceiling.  Slowly raise your hand until your arm is straight up in the air.  Repeat steps 1-3 with the other arm. Strengthening Exercise 2 You will need a light weight for this activity. As you grow stronger, you may use a heavier weight.  Standing with a weight in your hand, gradually move your straight arm in an arc, starting at your side, then out in front of you, then straight up over your head.  Gradually move your other arm in an arc, starting at your side, then out in front of you, then straight up over your head.  Repeat steps 1-2 with the other arm. Strengthening Exercise 3 You will need an elastic band for this activity. As you grow stronger, gradually increase the size of the bands or increase the number of bands that you use at one time. 1. While standing, hold an elastic band in one hand and raise that arm up in the air. 2. With your other hand, pull down the band until that hand is by your side. 3. Repeat steps 1-2 with the other arm.   This information is not intended to replace advice given to you by your health care provider. Make sure you discuss  any questions you have with your health care provider.   Document Released: 08/13/2003 Document Revised: 03/31/2015 Document Reviewed: 11/10/2014 Elsevier Interactive Patient Education 2016 [employer]  Contusion A contusion is a deep bruise. Contusions are the result of a blunt injury to tissues and muscle fibers under the skin. The injury causes bleeding under the skin. The skin overlying the contusion may turn blue, purple, or yellow. Minor injuries will give you a painless contusion, but more severe contusions may stay painful and swollen for a few weeks.  CAUSES  This condition is usually caused by a blow, trauma, or direct force to an area of the body. SYMPTOMS  Symptoms of this condition include:  Swelling of the injured area.  Pain and tenderness in the injured area.  Discoloration. The area may have redness and then turn blue, purple, or yellow. DIAGNOSIS  This condition is diagnosed based on a physical exam and medical history. An X-ray, CT scan, or MRI may be needed to determine if there are any associated injuries, such as broken bones (fractures). TREATMENT  Specific treatment for this condition depends on what area of the body was injured. In general, the best treatment for a contusion is resting, icing, applying pressure to (compression), and elevating the injured area. This is often called the RICE strategy. Over-the-counter anti-inflammatory medicines may also be recommended for pain control.  HOME CARE INSTRUCTIONS   Rest the injured area.  If directed, apply ice to the injured area:  Put ice in a plastic bag.  Place a towel between your skin and the bag.  Leave the ice on for 20 minutes, 2-3 times per day.  If directed, apply light compression to the injured area using an elastic bandage. Make sure the bandage is not wrapped too tightly. Remove and reapply the bandage as directed by your health care provider.  If possible, raise (elevate) the injured area  above the level of your heart while you are sitting or lying down.  Take over-the-counter and prescription medicines only as told by your health care provider. SEEK MEDICAL CARE IF:  Your symptoms do not improve after several days of treatment.  Your symptoms get worse.  You have difficulty moving the injured area. SEEK IMMEDIATE MEDICAL CARE IF:   You have severe pain.  You have numbness in a hand or foot.  Your hand or foot turns pale or cold.   This information is not intended to replace advice given to you by your health care provider. Make sure you discuss any questions you have with your health care provider.   Document Released: 08/24/2005 Document Revised: 08/05/2015 Document Reviewed: 04/01/2015 Elsevier Interactive Patient Education 2016 Else[employer] Inc.  Cryotherapy Cryotherapy means treatment with cold. Ice or gel packs can be used to reduce both pain and swelling. Ice is the most helpful within the first 24 to 48 hours after an injury or flare-up from overusing a muscle or joint. Sprains, strains, spasms, burning pain, shooting pain, and aches can all be eased with ice. Ice can also be used when recovering from surgery. Ice is effective, has very few side effects, and is safe for most people to use. PRECAUTIONS  Ice is not a safe treatment option for people with:  Raynaud phenomenon. This is a condition affecting small blood vessels in the extremities. Exposure to cold may cause your problems to return.  Cold hypersensitivity. There are many forms of cold hypersensitivity, including:  Cold urticaria. Red, itchy hives appear on the skin when the tissues begin to warm after being iced.  Cold erythema. This is a red, itchy rash caused by exposure to cold.  Cold hemoglobinuria. Red blood cells break down when the tissues begin to warm after being iced. The hemoglobin that carry oxygen are passed into the urine because they cannot combine with blood proteins fast  enough.  Numbness or altered sensitivity in the area being iced. If you have any of the following conditions, do not use ice until you have discussed cryotherapy with your caregiver:  Heart conditions, such as arrhythmia, angina, or chronic heart disease.  High blood pressure.  Healing wounds or open skin in the area being iced.  Current infections.  Rheumatoid arthritis.  Poor circulation.  Diabetes. Ice slows the blood flow in the region it is applied. This is beneficial when trying to stop inflamed tissues from spreading irritating chemicals to surrounding tissues. However, if you expose your skin to cold temperatures for too long or without the proper protection, you can damage your skin or nerves. Watch for signs of skin damage due to cold. HOME CARE INSTRUCTIONS Follow these tips to use ice and cold packs safely.  Place a dry or damp towel between the ice and skin. A damp towel will cool the skin more quickly, so you may need to shorten the time that the ice is used.  For a more rapid response, add gentle compression to the ice.  Ice for no more than 10 to 20 minutes at a time. The bonier the area you are icing, the less time it will take to get the benefits of ice.  Check your skin after 5 minutes to make sure there are no signs of a poor response to cold or skin damage.  Rest 20 minutes or more between uses.  Once your skin is numb, you can end your treatment. You can test numbness by very lightly touching your skin. The touch should be so light that you do not see the skin dimple from the pressure of your fingertip. When using ice, most people will feel these normal sensations in this order: cold, burning, aching, and numbness.  Do not  use ice on someone who cannot communicate their responses to pain, such as small children or people with dementia. HOW TO MAKE AN ICE PACK Ice packs are the most common way to use ice therapy. Other methods include ice massage, ice baths,  and cryosprays. Muscle creams that cause a cold, tingly feeling do not offer the same benefits that ice offers and should not be used as a substitute unless recommended by your caregiver. To make an ice pack, do one of the following:  Place crushed ice or a bag of frozen vegetables in a sealable plastic bag. Squeeze out the excess air. Place this bag inside another plastic bag. Slide the bag into a pillowcase or place a damp towel between your skin and the bag.  Mix 3 parts water with 1 part rubbing alcohol. Freeze the mixture in a sealable plastic bag. When you remove the mixture from the freezer, it will be slushy. Squeeze out the excess air. Place this bag inside another plastic bag. Slide the bag into a pillowcase or place a damp towel between your skin and the bag. SEEK MEDICAL CARE IF:  You develop white spots on your skin. This may give the skin a blotchy (mottled) appearance.  Your skin turns blue or pale.  Your skin becomes waxy or hard.  Your swelling gets worse. MAKE SURE YOU:   Understand these instructions.  Will watch your condition.  Will get help right away if you are not doing well or get worse.   This information is not intended to replace advice given to you by your health care provider. Make sure you discuss any questions you have with your health care provider.   Document Released: 07/11/2011 Document Revised: 12/05/2014 Document Reviewed: 07/11/2011 Elsevier Interactive Patient Education [employer]

## 2015-12-23 NOTE — ED Notes (Signed)
Patient returned from xray.

## 2015-12-23 NOTE — ED Provider Notes (Signed)
CSN: [id number]     Arrival date & time 12/23/15  1651 History  By signing my name below, I, Emmanuella Mensah, attest that this documentation has been prepared under the direction and in the presence of [id number], [employer]. Electronically Signed: Angelene Giovanni, ED Scribe. 12/23/2015. 6:07 PM.    Chief Complaint  Patient presents with  . Shoulder Injury   Patient is a 40 y.o. male presenting with shoulder injury. The history is provided by the patient. No language interpreter was used.  Shoulder Injury This is a new problem. The current episode started more than 2 days ago. The problem occurs rarely. The problem has been gradually worsening. Pertinent negatives include no chest pain, no abdominal pain and no shortness of breath. Exacerbated by: movement of arm. Nothing relieves the symptoms. He has tried acetaminophen for the symptoms. The treatment provided no relief.   HPI Comments: Scott Fischer is a 40 y.o. male who presents to the Emergency Department complaining of gradually worsening 10/10 intermittent sharp left shoulder pain that radiates toward his neck s/p fall that occurred 3 days ago. He explains that he was carrying his son when he fell down approx. 5 steps, landing on his L shoulder to break the fall. He denies any LOC or head injuries. Pain worsens with arm movement, and he has tried tylenol with no relief. He reports associated bruise to the posterior left shoulder.  He denies any joint swelling, loss of ROM, back pain, fever, chills, CP, SOB, abdominal pain, n/v/d, constipation, dysuria, hematuria, numbness/tingling, or weakness.   History reviewed. No pertinent past medical history. History reviewed. No pertinent past surgical history. No family history on file. Social History  Substance Use Topics  . Smoking status: Current Every Day Smoker  . Smokeless tobacco: None  . Alcohol Use: Yes    Review of Systems  Constitutional: Negative for fever and chills.    HENT: Negative for facial swelling (no head inj).   Respiratory: Negative for shortness of breath.   Cardiovascular: Negative for chest pain.  Gastrointestinal: Negative for nausea, vomiting, abdominal pain, diarrhea and constipation.  Genitourinary: Negative for dysuria and hematuria.  Musculoskeletal: Positive for arthralgias (left shoulder) and neck pain. Negative for back pain and joint swelling.  Skin: Positive for color change (bruise to L shoulder).  Allergic/Immunologic: Negative for immunocompromised state.  Neurological: Negative for syncope, weakness and numbness.  Psychiatric/Behavioral: Negative for confusion.  10 Systems reviewed and are negative for acute change except as noted in the HPI.     Allergies  Review of patient's allergies indicates no known allergies.  Home Medications   Prior to Admission medications   Medication Sig Start Date End Date Taking? Authorizing Provider  benzonatate (TESSALON) 100 MG capsule Take 1 capsule (100 mg total) by mouth 3 (three) times daily as needed for cough. 11/21/15   [name], MD  ibuprofen (ADVIL,MOTRIN) 800 MG tablet Take 1 tablet (800 mg total) by mouth every 8 (eight) hours as needed. 11/21/15   [name], MD   BP 140/92 mmHg  Pulse 73  Temp(Src) 98.2 F (36.8 C)  Resp 16  Ht 6' (1.829 m)  Wt 208 lb 3 oz (94.433 kg)  BMI 28.23 kg/m2  SpO2 100% Physical Exam  Constitutional: He is oriented to person, place, and time. Vital signs are normal. He appears well-developed and well-nourished.  Non-toxic appearance. No distress.  Afebrile, nontoxic, NAD  HENT:  Head: Normocephalic and atraumatic.  Mouth/Throat: Mucous membranes are normal.  Eyes:  Conjunctivae and EOM are normal. Right eye exhibits no discharge. Left eye exhibits no discharge.  Neck: Normal range of motion. Neck supple.  Cardiovascular: Normal rate and intact distal pulses.   Pulmonary/Chest: Effort normal. No respiratory distress.  Abdominal:  Normal appearance. He exhibits no distension.  Musculoskeletal: Normal range of motion.       Left shoulder: He exhibits tenderness, bony tenderness and spasm. He exhibits normal range of motion, no swelling, no crepitus, no deformity, normal pulse and normal strength.       Arms: Left shoulder with FROM intact with diffuse TTP to the scapula and deltoid with a bruise noted over the scapula, palpable muscle spasms, no swelling/effusion, no crepitus/deformity, negative apley scratch, +pain with resisted int/ext rotation, neg empty can test. Strength and sensation grossly intact in all extremities, distal pulses intact.   All spinal levels nonTTP without step offs or deformities.  Neurological: He is alert and oriented to person, place, and time. He has normal strength. No sensory deficit.  Skin: Skin is warm, dry and intact. Bruising noted. No rash noted.  Bruise to L shoulder as noted above  Psychiatric: He has a normal mood and affect.  Nursing note and vitals reviewed.   ED Course  Procedures (including critical care time) DIAGNOSTIC STUDIES: Oxygen Saturation is 100% on RA, normal by my interpretation.    COORDINATION OF CARE: 6:02 PM- Pt advised of plan for treatment and pt agrees. Pt will receive an x-ray for further evaluation. Pt denied pain medication.   Imaging Review Dg Shoulder Left  12/23/2015  CLINICAL DATA:  Pain following fall 3 days prior EXAM: LEFT SHOULDER - 2+ VIEW COMPARISON:  None FINDINGS: Frontal, Y scapular, and axillary images were obtained. There is no fracture or dislocation. Joint spaces appear intact. There are small unfused apophyses along the lateral acromion. No erosive change. IMPRESSION: No fracture or dislocation.  No appreciable arthropathy. Electronically Signed   By: Bretta Bang III M.D.   On: 12/23/2015 18:47     [place], PA-C has personally reviewed and evaluated these images as part of her medical decision-making.  MDM   Final  diagnoses:  Left shoulder pain  Shoulder contusion, left, initial encounter    40 y.o. male here with L shoulder pain s/p mechanical fall down stairs, striking his L shoulder. NVI with FROM intact, soft compartments. Some pain with resisted int/ext rotation, which could indicate rotator cuff injury. Bruising to scapula. Will obtain xray. Pt declines wanting pain meds. Will reassess shortly   6:54 PM Xray neg. Could be rotator cuff tendinopathy vs just contusion/strain. Discussed use of sling for no more than 3 days then perform ROM exercises thereafter. RICE discussed. Tylenol/naprosyn for pain. F/up with ortho in 1-2 wks for recheck of ongoing symptoms. I explained the diagnosis and have given explicit precautions to return to the ER including for any other new or worsening symptoms. The patient understands and accepts the medical plan as it's been dictated and I have answered their questions. Discharge instructions concerning home care and prescriptions have been given. The patient is STABLE and is discharged to home in good condition.   I personally performed the services described in this documentation, which was scribed in my presence. The recorded information has been reviewed and is accurate.  BP 140/92 mmHg  Pulse 73  Temp(Src) 98.2 F (36.8 C)  Resp 16  Ht 6' (1.829 m)  Wt 94.433 kg  BMI 28.23 kg/m2  SpO2 100%  Meds ordered  this encounter  Medications  . naproxen (NAPROSYN) 500 MG tablet    Sig: Take 1 tablet (500 mg total) by mouth 2 (two) times daily as needed for mild pain, moderate pain or headache (TAKE WITH MEALS.).    Dispense:  20 tablet    Refill:  0    Order Specific Question:  Supervising Provider    Answer:  Eber Hong [[id number]]     Aolani Piggott Camprubi-Soms, PA-C 12/23/15 1858  Linwood Dibbles, MD 12/26/15 [phone]

## 2015-12-23 NOTE — ED Notes (Signed)
The pt fell 3 days ago carrying his son painful lt shoulder since then

## 2016-01-04 ENCOUNTER — Encounter (HOSPITAL_COMMUNITY): Payer: Self-pay | Admitting: *Deleted

## 2016-01-04 ENCOUNTER — Emergency Department (HOSPITAL_COMMUNITY)
Admission: EM | Admit: 2016-01-04 | Discharge: 2016-01-04 | Disposition: A | Discharge status: Home / Self Care | Payer: Medicaid Other | Attending: Emergency Medicine | Admitting: Emergency Medicine

## 2016-01-04 DIAGNOSIS — S4992XA Unspecified injury of left shoulder and upper arm, initial encounter: Secondary | ICD-10-CM | POA: Insufficient documentation

## 2016-01-04 DIAGNOSIS — Y9289 Other specified places as the place of occurrence of the external cause: Secondary | ICD-10-CM | POA: Insufficient documentation

## 2016-01-04 DIAGNOSIS — Y9389 Activity, other specified: Secondary | ICD-10-CM | POA: Insufficient documentation

## 2016-01-04 DIAGNOSIS — Y998 Other external cause status: Secondary | ICD-10-CM | POA: Insufficient documentation

## 2016-01-04 DIAGNOSIS — F17200 Nicotine dependence, unspecified, uncomplicated: Secondary | ICD-10-CM | POA: Insufficient documentation

## 2016-01-04 DIAGNOSIS — W108XXA Fall (on) (from) other stairs and steps, initial encounter: Secondary | ICD-10-CM | POA: Insufficient documentation

## 2016-01-04 DIAGNOSIS — M25512 Pain in left shoulder: Secondary | ICD-10-CM

## 2016-01-04 MED ORDER — HYDROCODONE-ACETAMINOPHEN 5-325 MG PO TABS
2.0000 | ORAL_TABLET | Freq: Once | ORAL | Status: AC
  Administered 2016-01-04: 2 via ORAL
  Filled 2016-01-04: qty 2

## 2016-01-04 MED ORDER — TRAMADOL HCL 50 MG PO TABS: 50.0000 mg | ORAL_TABLET | Freq: Four times a day (QID) | ORAL | Status: DC | PRN

## 2016-01-04 MED ORDER — NAPROXEN 500 MG PO TABS: 500.0000 mg | ORAL_TABLET | Freq: Two times a day (BID) | ORAL | Status: DC
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
## 2016-01-04 NOTE — Discharge Instructions (Signed)
Shoulder Pain Keep your follow-up appointment with orthopedics in 2 days. The shoulder is the joint that connects your arms to your body. The bones that form the shoulder joint include the upper arm bone (humerus), the shoulder blade (scapula), and the collarbone (clavicle). The top of the humerus is shaped like a ball and fits into a rather flat socket on the scapula (glenoid cavity). A combination of muscles and strong, fibrous tissues that connect muscles to bones (tendons) support your shoulder joint and hold the ball in the socket. Small, fluid-filled sacs (bursae) are located in different areas of the joint. They act as cushions between the bones and the overlying soft tissues and help reduce friction between the gliding tendons and the bone as you move your arm. Your shoulder joint allows a wide range of motion in your arm. This range of motion allows you to do things like scratch your back or throw a ball. However, this range of motion also makes your shoulder more prone to pain from overuse and injury. Causes of shoulder pain can originate from both injury and overuse and usually can be grouped in the following four categories:  Redness, swelling, and pain (inflammation) of the tendon (tendinitis) or the bursae (bursitis).  Instability, such as a dislocation of the joint.  Inflammation of the joint (arthritis).  Broken bone (fracture). HOME CARE INSTRUCTIONS   Apply ice to the sore area.  Put ice in a plastic bag.  Place a towel between your skin and the bag.  Leave the ice on for 15-20 minutes, 3-4 times per day for the first 2 days, or as directed by your health care provider.  Stop using cold packs if they do not help with the pain.  If you have a shoulder sling or immobilizer, wear it as long as your caregiver instructs. Only remove it to shower or bathe. Move your arm as little as possible, but keep your hand moving to prevent swelling.  Squeeze a soft ball or foam pad as much  as possible to help prevent swelling.  Only take over-the-counter or prescription medicines for pain, discomfort, or fever as directed by your caregiver. SEEK MEDICAL CARE IF:   Your shoulder pain increases, or new pain develops in your arm, hand, or fingers.  Your hand or fingers become cold and numb.  Your pain is not relieved with medicines. SEEK IMMEDIATE MEDICAL CARE IF:   Your arm, hand, or fingers are numb or tingling.  Your arm, hand, or fingers are significantly swollen or turn white or blue. MAKE SURE YOU:   Understand these instructions.  Will watch your condition.  Will get help right away if you are not doing well or get worse.   This information is not intended to replace advice given to you by your health care provider. Make sure you discuss any questions you have with your health care provider.   Document Released: 08/24/2005 Document Revised: 12/05/2014 Document Reviewed: 03/09/2015 Elsevier Interactive Patient Education [employer]

## 2016-04-16 ENCOUNTER — Emergency Department (HOSPITAL_COMMUNITY): Payer: Self-pay

## 2016-04-16 ENCOUNTER — Encounter (HOSPITAL_COMMUNITY): Payer: Self-pay | Admitting: Emergency Medicine

## 2016-04-16 ENCOUNTER — Emergency Department (HOSPITAL_COMMUNITY)
Admission: EM | Admit: 2016-04-16 | Discharge: 2016-04-16 | Disposition: A | Discharge status: Home / Self Care | Payer: Self-pay | Attending: Emergency Medicine | Admitting: Emergency Medicine

## 2016-04-16 DIAGNOSIS — Y929 Unspecified place or not applicable: Secondary | ICD-10-CM | POA: Insufficient documentation

## 2016-04-16 DIAGNOSIS — M25512 Pain in left shoulder: Secondary | ICD-10-CM

## 2016-04-16 DIAGNOSIS — M5410 Radiculopathy, site unspecified: Secondary | ICD-10-CM

## 2016-04-16 DIAGNOSIS — Z791 Long term (current) use of non-steroidal anti-inflammatories (NSAID): Secondary | ICD-10-CM | POA: Insufficient documentation

## 2016-04-16 DIAGNOSIS — Y999 Unspecified external cause status: Secondary | ICD-10-CM | POA: Insufficient documentation

## 2016-04-16 DIAGNOSIS — Y939 Activity, unspecified: Secondary | ICD-10-CM | POA: Insufficient documentation

## 2016-04-16 DIAGNOSIS — Z79891 Long term (current) use of opiate analgesic: Secondary | ICD-10-CM | POA: Insufficient documentation

## 2016-04-16 DIAGNOSIS — S4992XA Unspecified injury of left shoulder and upper arm, initial encounter: Secondary | ICD-10-CM | POA: Insufficient documentation

## 2016-04-16 DIAGNOSIS — F17200 Nicotine dependence, unspecified, uncomplicated: Secondary | ICD-10-CM | POA: Insufficient documentation

## 2016-04-16 MED ORDER — PREDNISONE 20 MG PO TABS
60.0000 mg | ORAL_TABLET | Freq: Once | ORAL | Status: AC
  Administered 2016-04-16: 60 mg via ORAL
  Filled 2016-04-16: qty 3

## 2016-04-16 MED ORDER — IBUPROFEN 200 MG PO TABS
600.0000 mg | ORAL_TABLET | Freq: Once | ORAL | Status: AC
  Administered 2016-04-16: 600 mg via ORAL
  Filled 2016-04-16: qty 3

## 2016-04-16 MED ORDER — PREDNISONE 20 MG PO TABS: 40.0000 mg | ORAL_TABLET | Freq: Every day | ORAL | Status: DC

## 2016-04-16 MED ORDER — IBUPROFEN 600 MG PO TABS: 600.0000 mg | ORAL_TABLET | Freq: Four times a day (QID) | ORAL | Status: DC | PRN
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
## 2016-04-16 NOTE — Discharge Instructions (Signed)
Your x-ray today showed no new abnormalities. I will give you a couple prescriptions to help with your symptoms. Please call Dr. Daria PasturesAliusio's office to schedule an orthopedic follow up evaluation. Return to the ER for new or worsening symptoms.
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
## 2016-04-25 ENCOUNTER — Encounter (HOSPITAL_COMMUNITY): Payer: Self-pay

## 2016-04-25 ENCOUNTER — Emergency Department (HOSPITAL_COMMUNITY): Payer: Self-pay

## 2016-04-25 ENCOUNTER — Emergency Department (HOSPITAL_COMMUNITY)
Admission: EM | Admit: 2016-04-25 | Discharge: 2016-04-25 | Disposition: A | Discharge status: Home / Self Care | Payer: Self-pay | Attending: Emergency Medicine | Admitting: Emergency Medicine

## 2016-04-25 DIAGNOSIS — S6992XA Unspecified injury of left wrist, hand and finger(s), initial encounter: Secondary | ICD-10-CM | POA: Insufficient documentation

## 2016-04-25 DIAGNOSIS — Z79899 Other long term (current) drug therapy: Secondary | ICD-10-CM | POA: Insufficient documentation

## 2016-04-25 DIAGNOSIS — Y939 Activity, unspecified: Secondary | ICD-10-CM | POA: Insufficient documentation

## 2016-04-25 DIAGNOSIS — Z87891 Personal history of nicotine dependence: Secondary | ICD-10-CM | POA: Insufficient documentation

## 2016-04-25 DIAGNOSIS — Y999 Unspecified external cause status: Secondary | ICD-10-CM | POA: Insufficient documentation

## 2016-04-25 DIAGNOSIS — Z7952 Long term (current) use of systemic steroids: Secondary | ICD-10-CM | POA: Insufficient documentation

## 2016-04-25 DIAGNOSIS — W231XXA Caught, crushed, jammed, or pinched between stationary objects, initial encounter: Secondary | ICD-10-CM | POA: Insufficient documentation

## 2016-04-25 DIAGNOSIS — Y929 Unspecified place or not applicable: Secondary | ICD-10-CM | POA: Insufficient documentation

## 2016-04-25 DIAGNOSIS — Z791 Long term (current) use of non-steroidal anti-inflammatories (NSAID): Secondary | ICD-10-CM | POA: Insufficient documentation

## 2016-04-25 MED ORDER — NAPROXEN 500 MG PO TABS: 500.0000 mg | ORAL_TABLET | Freq: Two times a day (BID) | ORAL | Status: DC

## 2016-04-25 MED ORDER — DIAZEPAM 5 MG PO TABS: 5.0000 mg | ORAL_TABLET | Freq: Two times a day (BID) | ORAL | Status: DC
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
## 2016-04-25 NOTE — Discharge Instructions (Signed)
Mr. Jena GaussSteveie Jerome Overfield,  Nice meeting you! Please follow-up with your primary care provider. Return to the emergency department if you develop numbness/tingling, inability to move your hand, discolorations. Feel better soon!  S. Lane HackerNicole Vani Gunner, PA-C

## 2016-05-18 ENCOUNTER — Emergency Department (HOSPITAL_COMMUNITY): Payer: Self-pay

## 2016-05-18 ENCOUNTER — Emergency Department (HOSPITAL_COMMUNITY)
Admission: EM | Admit: 2016-05-18 | Discharge: 2016-05-19 | Disposition: A | Discharge status: Home / Self Care | Payer: Self-pay | Attending: Dermatology | Admitting: Dermatology

## 2016-05-18 ENCOUNTER — Encounter (HOSPITAL_COMMUNITY): Payer: Self-pay | Admitting: *Deleted

## 2016-05-18 DIAGNOSIS — M79604 Pain in right leg: Secondary | ICD-10-CM | POA: Insufficient documentation

## 2016-05-18 DIAGNOSIS — M79605 Pain in left leg: Secondary | ICD-10-CM | POA: Insufficient documentation

## 2016-05-18 DIAGNOSIS — Z5321 Procedure and treatment not carried out due to patient leaving prior to being seen by health care provider: Secondary | ICD-10-CM | POA: Insufficient documentation

## 2016-05-18 DIAGNOSIS — M25512 Pain in left shoulder: Secondary | ICD-10-CM | POA: Insufficient documentation

## 2016-05-18 NOTE — ED Notes (Signed)
Pt states he already talked to an Technical sales engineerofficer. Pt asked to call someone, brought pt out to the lobby and then he said he did not know the number and couldn't make a call. Updated pt he will be going for xrays.
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
## 2016-05-18 NOTE — ED Notes (Signed)
Called pt in lobby area for rounding, no response.

## 2016-05-19 NOTE — ED Notes (Signed)
Pt not in lobby.  

## 2016-05-21 ENCOUNTER — Encounter (HOSPITAL_COMMUNITY): Payer: Self-pay | Admitting: *Deleted

## 2016-05-21 ENCOUNTER — Emergency Department (HOSPITAL_COMMUNITY)
Admission: EM | Admit: 2016-05-21 | Discharge: 2016-05-22 | Disposition: A | Discharge status: Home / Self Care | Payer: Self-pay | Attending: Emergency Medicine | Admitting: Emergency Medicine

## 2016-05-21 ENCOUNTER — Emergency Department (HOSPITAL_COMMUNITY): Payer: Self-pay

## 2016-05-21 DIAGNOSIS — S7002XA Contusion of left hip, initial encounter: Secondary | ICD-10-CM | POA: Insufficient documentation

## 2016-05-21 DIAGNOSIS — Z87891 Personal history of nicotine dependence: Secondary | ICD-10-CM | POA: Insufficient documentation

## 2016-05-21 DIAGNOSIS — S00511A Abrasion of lip, initial encounter: Secondary | ICD-10-CM | POA: Insufficient documentation

## 2016-05-21 DIAGNOSIS — Y929 Unspecified place or not applicable: Secondary | ICD-10-CM | POA: Insufficient documentation

## 2016-05-21 DIAGNOSIS — W503XXA Accidental bite by another person, initial encounter: Secondary | ICD-10-CM

## 2016-05-21 DIAGNOSIS — Y999 Unspecified external cause status: Secondary | ICD-10-CM | POA: Insufficient documentation

## 2016-05-21 DIAGNOSIS — S0083XA Contusion of other part of head, initial encounter: Secondary | ICD-10-CM | POA: Insufficient documentation

## 2016-05-21 DIAGNOSIS — Y9301 Activity, walking, marching and hiking: Secondary | ICD-10-CM | POA: Insufficient documentation

## 2016-05-21 MED ORDER — AMOXICILLIN-POT CLAVULANATE 875-125 MG PO TABS
1.0000 | ORAL_TABLET | Freq: Once | ORAL | Status: AC
  Administered 2016-05-21: 1 via ORAL
  Filled 2016-05-21: qty 1

## 2016-05-21 MED ORDER — TETANUS-DIPHTH-ACELL PERTUSSIS 5-2.5-18.5 LF-MCG/0.5 IM SUSP
0.5000 mL | Freq: Once | INTRAMUSCULAR | Status: AC
  Administered 2016-05-21: 0.5 mL via INTRAMUSCULAR
  Filled 2016-05-21: qty 0.5

## 2016-05-21 MED ORDER — NAPROXEN 500 MG PO TABS: 500.0000 mg | ORAL_TABLET | Freq: Two times a day (BID) | ORAL | Status: DC

## 2016-05-21 MED ORDER — METHOCARBAMOL 500 MG PO TABS: 500.0000 mg | ORAL_TABLET | Freq: Two times a day (BID) | ORAL | Status: DC

## 2016-05-21 MED ORDER — IBUPROFEN 800 MG PO TABS
800.0000 mg | ORAL_TABLET | Freq: Once | ORAL | Status: AC
Start: 2016-05-21 — End: 2016-05-21
  Administered 2016-05-21: 800 mg via ORAL
  Filled 2016-05-21: qty 1

## 2016-05-21 MED ORDER — AMOXICILLIN-POT CLAVULANATE 875-125 MG PO TABS: 1.0000 | ORAL_TABLET | Freq: Two times a day (BID) | ORAL | Status: DC
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
## 2016-05-21 NOTE — ED Notes (Signed)
When pt called for triage pt was sitting in lobby eating a plate of food
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
## 2016-05-21 NOTE — ED Notes (Signed)
Pt in Ct  

## 2016-08-09 ENCOUNTER — Emergency Department (HOSPITAL_COMMUNITY)
Admission: EM | Admit: 2016-08-09 | Discharge: 2016-08-09 | Disposition: A | Discharge status: Home / Self Care | Payer: Self-pay | Attending: Emergency Medicine | Admitting: Emergency Medicine

## 2016-08-09 ENCOUNTER — Encounter (HOSPITAL_COMMUNITY): Payer: Self-pay

## 2016-08-09 DIAGNOSIS — Y999 Unspecified external cause status: Secondary | ICD-10-CM | POA: Insufficient documentation

## 2016-08-09 DIAGNOSIS — S21251A Open bite of right back wall of thorax without penetration into thoracic cavity, initial encounter: Secondary | ICD-10-CM | POA: Insufficient documentation

## 2016-08-09 DIAGNOSIS — Y939 Activity, unspecified: Secondary | ICD-10-CM | POA: Insufficient documentation

## 2016-08-09 DIAGNOSIS — W503XXA Accidental bite by another person, initial encounter: Secondary | ICD-10-CM | POA: Insufficient documentation

## 2016-08-09 DIAGNOSIS — Z87891 Personal history of nicotine dependence: Secondary | ICD-10-CM | POA: Insufficient documentation

## 2016-08-09 DIAGNOSIS — Y929 Unspecified place or not applicable: Secondary | ICD-10-CM | POA: Insufficient documentation

## 2016-08-09 MED ORDER — IBUPROFEN 800 MG PO TABS: 800.0000 mg | ORAL_TABLET | Freq: Three times a day (TID) | ORAL | 0 refills | Status: DC

## 2016-08-09 MED ORDER — AMOXICILLIN-POT CLAVULANATE 875-125 MG PO TABS
1.0000 | ORAL_TABLET | Freq: Once | ORAL | Status: AC
  Administered 2016-08-09: 1 via ORAL
  Filled 2016-08-09: qty 1

## 2016-08-09 MED ORDER — IBUPROFEN 800 MG PO TABS
800.0000 mg | ORAL_TABLET | Freq: Once | ORAL | Status: AC
  Administered 2016-08-09: 800 mg via ORAL
  Filled 2016-08-09: qty 1

## 2016-08-09 MED ORDER — AMOXICILLIN-POT CLAVULANATE 875-125 MG PO TABS: 1.0000 | ORAL_TABLET | Freq: Two times a day (BID) | ORAL | 0 refills | Status: DC
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
## 2016-08-09 NOTE — ED Notes (Signed)
Bed: WTR9 Expected date:  Expected time:  Means of arrival:  Comments: 
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
## 2016-09-21 ENCOUNTER — Emergency Department (HOSPITAL_COMMUNITY): Payer: Self-pay

## 2016-09-21 ENCOUNTER — Emergency Department (HOSPITAL_COMMUNITY)
Admission: EM | Admit: 2016-09-21 | Discharge: 2016-09-21 | Disposition: A | Discharge status: Home / Self Care | Payer: Self-pay | Attending: Emergency Medicine | Admitting: Emergency Medicine

## 2016-09-21 ENCOUNTER — Encounter (HOSPITAL_COMMUNITY): Payer: Self-pay

## 2016-09-21 DIAGNOSIS — W228XXA Striking against or struck by other objects, initial encounter: Secondary | ICD-10-CM | POA: Insufficient documentation

## 2016-09-21 DIAGNOSIS — Z87891 Personal history of nicotine dependence: Secondary | ICD-10-CM | POA: Insufficient documentation

## 2016-09-21 DIAGNOSIS — Y939 Activity, unspecified: Secondary | ICD-10-CM | POA: Insufficient documentation

## 2016-09-21 DIAGNOSIS — Y999 Unspecified external cause status: Secondary | ICD-10-CM | POA: Insufficient documentation

## 2016-09-21 DIAGNOSIS — Z79899 Other long term (current) drug therapy: Secondary | ICD-10-CM | POA: Insufficient documentation

## 2016-09-21 DIAGNOSIS — S62367A Nondisplaced fracture of neck of fifth metacarpal bone, left hand, initial encounter for closed fracture: Secondary | ICD-10-CM | POA: Insufficient documentation

## 2016-09-21 DIAGNOSIS — Y929 Unspecified place or not applicable: Secondary | ICD-10-CM | POA: Insufficient documentation

## 2016-09-21 MED ORDER — HYDROCODONE-ACETAMINOPHEN 5-325 MG PO TABS: 1.0000 | ORAL_TABLET | ORAL | 0 refills | Status: DC | PRN

## 2016-09-21 NOTE — Discharge Instructions (Signed)
Please read attached information. If you experience any new or worsening signs or symptoms please return to the emergency room for evaluation. Please follow-up with your primary care provider or specialist as discussed.  °
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
## 2016-09-21 NOTE — Progress Notes (Signed)
This pt informs ED CM [facility] to [facility]" VA services are benefit services Pt states [place] been staying in [place] "about two years now"  CM offered him the uninsured [place] resources and medication resources to assist as needed These resources includes written information to assist pt with determining choice for uninsured accepting pcps, discussed the importance of pcp vs EDP services for f/u care, www.needymeds.org, www.goodrx.com, discounted pharmacies and other [place] resources such as [facility] , [facility], affordable care act, financial assistance, uninsured dental services, [place] med assist, DSS and  health department  Reviewed resources for [place] uninsured accepting pcps like [facility], [facility] medicine [facility] [address], [facility], [facility], local urgent care centers, [facility], [facility], general medical clinics, family services of the [place], [facility] plus others, medication resources, [facility] out patient pharmacies and housing Pt voiced understanding and appreciation of resources provided   Provided [facility] contact information
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
## 2016-10-02 ENCOUNTER — Encounter (HOSPITAL_COMMUNITY): Payer: Self-pay | Admitting: Emergency Medicine

## 2016-10-02 ENCOUNTER — Emergency Department (HOSPITAL_COMMUNITY): Payer: Medicaid Other

## 2016-10-02 ENCOUNTER — Emergency Department (HOSPITAL_COMMUNITY)
Admission: EM | Admit: 2016-10-02 | Discharge: 2016-10-02 | Disposition: A | Discharge status: Home / Self Care | Payer: Medicaid Other | Attending: Emergency Medicine | Admitting: Emergency Medicine

## 2016-10-02 DIAGNOSIS — Z87891 Personal history of nicotine dependence: Secondary | ICD-10-CM | POA: Insufficient documentation

## 2016-10-02 DIAGNOSIS — S62307D Unspecified fracture of fifth metacarpal bone, left hand, subsequent encounter for fracture with routine healing: Secondary | ICD-10-CM | POA: Insufficient documentation

## 2016-10-02 DIAGNOSIS — S62339D Displaced fracture of neck of unspecified metacarpal bone, subsequent encounter for fracture with routine healing: Secondary | ICD-10-CM

## 2016-10-02 DIAGNOSIS — M25521 Pain in right elbow: Secondary | ICD-10-CM | POA: Insufficient documentation

## 2016-10-02 DIAGNOSIS — M25529 Pain in unspecified elbow: Secondary | ICD-10-CM

## 2016-10-02 MED ORDER — HYDROCODONE-ACETAMINOPHEN 5-325 MG PO TABS
1.0000 | ORAL_TABLET | Freq: Once | ORAL | Status: AC
  Administered 2016-10-02: 1 via ORAL
  Filled 2016-10-02: qty 1

## 2016-10-02 MED ORDER — HYDROCODONE-ACETAMINOPHEN 5-325 MG PO TABS: 1.0000 | ORAL_TABLET | ORAL | 0 refills | Status: DC | PRN
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
## 2016-10-02 NOTE — Discharge Instructions (Signed)
Your x-ray showed a stable healing left boxer fracture. There is no other fractures or dislocations noted on x-ray of left hand and right elbow. Splint replaced please keep on. He may use ice as needed for swelling. I will give you a short course of pain medicine. He may also continue to use ibuprofen for pain and swelling. He follow-up with orthopedist this week. Discussed with her [facility] manager for [facility] or through referral. Return to the ED if your symptoms worsen or he develop worsening pain, swelling, redness.

## 2016-11-07 ENCOUNTER — Encounter (HOSPITAL_COMMUNITY): Payer: Self-pay | Admitting: Nurse Practitioner

## 2016-11-07 ENCOUNTER — Emergency Department (HOSPITAL_COMMUNITY)
Admission: EM | Admit: 2016-11-07 | Discharge: 2016-11-08 | Disposition: A | Discharge status: Other Acute Inpatient Hospital | Payer: Medicaid Other | Attending: Emergency Medicine | Admitting: Emergency Medicine

## 2016-11-07 DIAGNOSIS — F4325 Adjustment disorder with mixed disturbance of emotions and conduct: Secondary | ICD-10-CM | POA: Insufficient documentation

## 2016-11-07 DIAGNOSIS — Z5181 Encounter for therapeutic drug level monitoring: Secondary | ICD-10-CM | POA: Insufficient documentation

## 2016-11-07 DIAGNOSIS — Z87891 Personal history of nicotine dependence: Secondary | ICD-10-CM | POA: Insufficient documentation

## 2016-11-07 DIAGNOSIS — R45851 Suicidal ideations: Secondary | ICD-10-CM

## 2016-11-07 DIAGNOSIS — R45850 Homicidal ideations: Secondary | ICD-10-CM | POA: Insufficient documentation

## 2016-11-07 DIAGNOSIS — F10920 Alcohol use, unspecified with intoxication, uncomplicated: Secondary | ICD-10-CM

## 2016-11-07 DIAGNOSIS — F10120 Alcohol abuse with intoxication, uncomplicated: Secondary | ICD-10-CM | POA: Insufficient documentation

## 2016-11-07 LAB — RAPID URINE DRUG SCREEN, HOSP PERFORMED
Amphetamines: NOT DETECTED
BARBITURATES: NOT DETECTED
BENZODIAZEPINES: NOT DETECTED
COCAINE: NOT DETECTED
Opiates: NOT DETECTED
Tetrahydrocannabinol: NOT DETECTED

## 2016-11-07 LAB — COMPREHENSIVE METABOLIC PANEL
ALBUMIN: 5.1 g/dL — AB (ref 3.5–5.0)
ALK PHOS: 115 U/L (ref 38–126)
ALT: 28 U/L (ref 17–63)
ANION GAP: 9 (ref 5–15)
AST: 41 U/L (ref 15–41)
BILIRUBIN TOTAL: 0.7 mg/dL (ref 0.3–1.2)
BUN: 11 mg/dL (ref 6–20)
CALCIUM: 8.6 mg/dL — AB (ref 8.9–10.3)
CO2: 25 mmol/L (ref 22–32)
Chloride: 107 mmol/L (ref 101–111)
Creatinine, Ser: 1.03 mg/dL (ref 0.61–1.24)
Glucose, Bld: 96 mg/dL (ref 65–99)
POTASSIUM: 3.5 mmol/L (ref 3.5–5.1)
Sodium: 141 mmol/L (ref 135–145)
TOTAL PROTEIN: 9.1 g/dL — AB (ref 6.5–8.1)

## 2016-11-07 LAB — CBC
HCT: 41.9 % (ref 39.0–52.0)
Hemoglobin: 14.5 g/dL (ref 13.0–17.0)
MCH: 31.5 pg (ref 26.0–34.0)
MCHC: 34.6 g/dL (ref 30.0–36.0)
MCV: 90.9 fL (ref 78.0–100.0)
Platelets: 261 10*3/uL (ref 150–400)
RBC: 4.61 MIL/uL (ref 4.22–5.81)
RDW: 12.9 % (ref 11.5–15.5)
WBC: 4.2 10*3/uL (ref 4.0–10.5)

## 2016-11-07 LAB — SALICYLATE LEVEL

## 2016-11-07 LAB — ACETAMINOPHEN LEVEL

## 2016-11-07 LAB — ETHANOL: ALCOHOL ETHYL (B): 302 mg/dL — AB (ref <5)

## 2016-11-07 MED ORDER — ONDANSETRON HCL 4 MG PO TABS: 4.0000 mg | ORAL_TABLET | Freq: Three times a day (TID) | ORAL | Status: DC | PRN

## 2016-11-07 MED ORDER — THIAMINE HCL 100 MG/ML IJ SOLN: 100.0000 mg | Freq: Every day | INTRAMUSCULAR | Status: DC

## 2016-11-07 MED ORDER — VITAMIN B-1 100 MG PO TABS
100.0000 mg | ORAL_TABLET | Freq: Every day | ORAL | Status: DC
  Administered 2016-11-07 – 2016-11-08 (×2): 100 mg via ORAL
  Filled 2016-11-07 (×2): qty 1

## 2016-11-07 MED ORDER — ZOLPIDEM TARTRATE 5 MG PO TABS
5.0000 mg | ORAL_TABLET | Freq: Every evening | ORAL | Status: DC | PRN
  Filled 2016-11-07: qty 1

## 2016-11-07 MED ORDER — LORAZEPAM 1 MG PO TABS: 0.0000 mg | ORAL_TABLET | Freq: Two times a day (BID) | ORAL | Status: DC

## 2016-11-07 MED ORDER — NICOTINE 21 MG/24HR TD PT24
21.0000 mg | MEDICATED_PATCH | Freq: Every day | TRANSDERMAL | Status: DC
  Filled 2016-11-07: qty 1

## 2016-11-07 MED ORDER — LORAZEPAM 1 MG PO TABS
0.0000 mg | ORAL_TABLET | Freq: Four times a day (QID) | ORAL | Status: DC
  Administered 2016-11-07: 2 mg via ORAL
  Administered 2016-11-08: 1 mg via ORAL
  Filled 2016-11-07: qty 1
  Filled 2016-11-07: qty 2

## 2016-11-07 MED ORDER — ALUM & MAG HYDROXIDE-SIMETH 200-200-20 MG/5ML PO SUSP: 30.0000 mL | ORAL | Status: DC | PRN

## 2016-11-07 MED ORDER — ACETAMINOPHEN 325 MG PO TABS: 650.0000 mg | ORAL_TABLET | ORAL | Status: DC | PRN

## 2016-11-07 MED ORDER — IBUPROFEN 200 MG PO TABS: 600.0000 mg | ORAL_TABLET | Freq: Three times a day (TID) | ORAL | Status: DC | PRN
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
## 2016-11-07 NOTE — ED Notes (Signed)
Collected and sent down blood specimens. Orders still active in epic but no task to click off. 9 tubes sent down.

## 2016-11-07 NOTE — ED Notes (Signed)
Bed: WA30 Expected date:  Expected time:  Means of arrival:  Comments: [employer]-[facility] 

## 2016-11-07 NOTE — ED Notes (Signed)
Patient says he has a case worker through [facility]. Her name is Orson AloeDiane Dunn: [phone]

## 2016-11-07 NOTE — Progress Notes (Signed)
Patient noted to have been seen in the ED 6 times within the last six months.  Patient presents to Ed with HI. Patient is under IVC.   Urine tox screen negative.  ETOH blood level 302.  Patient listed as having Medicaid Family Planning.  This insurance does not cover the price of medications.  Per chart review, [place]cp [place] the [facility] [place].
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
## 2016-11-08 ENCOUNTER — Encounter (HOSPITAL_COMMUNITY): Payer: Self-pay

## 2016-11-08 ENCOUNTER — Observation Stay (HOSPITAL_COMMUNITY)
Admission: AD | Admit: 2016-11-08 | Discharge: 2016-11-09 | Disposition: A | Discharge status: Home / Self Care | Payer: Self-pay | Source: Intra-hospital | Attending: Psychiatry | Admitting: Psychiatry

## 2016-11-08 DIAGNOSIS — R45851 Suicidal ideations: Secondary | ICD-10-CM | POA: Insufficient documentation

## 2016-11-08 DIAGNOSIS — Z87891 Personal history of nicotine dependence: Secondary | ICD-10-CM | POA: Insufficient documentation

## 2016-11-08 DIAGNOSIS — F329 Major depressive disorder, single episode, unspecified: Secondary | ICD-10-CM | POA: Insufficient documentation

## 2016-11-08 DIAGNOSIS — R45850 Homicidal ideations: Secondary | ICD-10-CM | POA: Insufficient documentation

## 2016-11-08 DIAGNOSIS — F1020 Alcohol dependence, uncomplicated: Secondary | ICD-10-CM | POA: Insufficient documentation

## 2016-11-08 DIAGNOSIS — Z79899 Other long term (current) drug therapy: Secondary | ICD-10-CM

## 2016-11-08 DIAGNOSIS — F1024 Alcohol dependence with alcohol-induced mood disorder: Secondary | ICD-10-CM

## 2016-11-08 DIAGNOSIS — F4325 Adjustment disorder with mixed disturbance of emotions and conduct: Principal | ICD-10-CM | POA: Insufficient documentation

## 2016-11-08 DIAGNOSIS — F411 Generalized anxiety disorder: Secondary | ICD-10-CM | POA: Insufficient documentation

## 2016-11-08 DIAGNOSIS — G4700 Insomnia, unspecified: Secondary | ICD-10-CM | POA: Insufficient documentation

## 2016-11-08 MED ORDER — LOPERAMIDE HCL 2 MG PO CAPS: 2.0000 mg | ORAL_CAPSULE | ORAL | Status: DC | PRN

## 2016-11-08 MED ORDER — CARBAMAZEPINE ER 200 MG PO TB12: 200.0000 mg | ORAL_TABLET | Freq: Two times a day (BID) | ORAL | Status: DC

## 2016-11-08 MED ORDER — NICOTINE 21 MG/24HR TD PT24
21.0000 mg | MEDICATED_PATCH | Freq: Every day | TRANSDERMAL | Status: DC
  Filled 2016-11-08: qty 1

## 2016-11-08 MED ORDER — MAGNESIUM HYDROXIDE 400 MG/5ML PO SUSP: 30.0000 mL | Freq: Every day | ORAL | Status: DC | PRN

## 2016-11-08 MED ORDER — IBUPROFEN 600 MG PO TABS: 600.0000 mg | ORAL_TABLET | Freq: Three times a day (TID) | ORAL | Status: DC | PRN

## 2016-11-08 MED ORDER — GABAPENTIN 300 MG PO CAPS: 300.0000 mg | ORAL_CAPSULE | Freq: Three times a day (TID) | ORAL | Status: DC

## 2016-11-08 MED ORDER — ONDANSETRON HCL 4 MG PO TABS
4.0000 mg | ORAL_TABLET | Freq: Three times a day (TID) | ORAL | Status: DC | PRN
Start: 2016-11-08 — End: 2016-11-09

## 2016-11-08 MED ORDER — VITAMIN B-1 100 MG PO TABS
100.0000 mg | ORAL_TABLET | Freq: Every day | ORAL | Status: DC
  Administered 2016-11-09: 100 mg via ORAL
  Filled 2016-11-08: qty 1

## 2016-11-08 MED ORDER — CHLORDIAZEPOXIDE HCL 25 MG PO CAPS: 25.0000 mg | ORAL_CAPSULE | Freq: Three times a day (TID) | ORAL | Status: DC

## 2016-11-08 MED ORDER — CARBAMAZEPINE ER 200 MG PO TB12
200.0000 mg | ORAL_TABLET | Freq: Two times a day (BID) | ORAL | Status: DC
  Administered 2016-11-08 – 2016-11-09 (×2): 200 mg via ORAL
  Filled 2016-11-08 (×2): qty 1

## 2016-11-08 MED ORDER — CHLORDIAZEPOXIDE HCL 25 MG PO CAPS: 25.0000 mg | ORAL_CAPSULE | Freq: Four times a day (QID) | ORAL | Status: DC | PRN

## 2016-11-08 MED ORDER — CHLORDIAZEPOXIDE HCL 25 MG PO CAPS
25.0000 mg | ORAL_CAPSULE | Freq: Four times a day (QID) | ORAL | Status: DC
  Administered 2016-11-08 – 2016-11-09 (×4): 25 mg via ORAL
  Filled 2016-11-08 (×4): qty 1

## 2016-11-08 MED ORDER — CHLORDIAZEPOXIDE HCL 25 MG PO CAPS: 25.0000 mg | ORAL_CAPSULE | Freq: Every day | ORAL | Status: DC

## 2016-11-08 MED ORDER — CHLORDIAZEPOXIDE HCL 25 MG PO CAPS: 25.0000 mg | ORAL_CAPSULE | ORAL | Status: DC

## 2016-11-08 MED ORDER — ADULT MULTIVITAMIN W/MINERALS CH
1.0000 | ORAL_TABLET | Freq: Every day | ORAL | Status: DC
  Administered 2016-11-08 – 2016-11-09 (×2): 1 via ORAL
  Filled 2016-11-08: qty 1

## 2016-11-08 MED ORDER — HYDROXYZINE HCL 25 MG PO TABS
25.0000 mg | ORAL_TABLET | Freq: Four times a day (QID) | ORAL | Status: DC | PRN
  Administered 2016-11-08: 25 mg via ORAL
  Filled 2016-11-08: qty 1

## 2016-11-08 MED ORDER — ALUM & MAG HYDROXIDE-SIMETH 200-200-20 MG/5ML PO SUSP: 30.0000 mL | ORAL | Status: DC | PRN

## 2016-11-08 MED ORDER — ACETAMINOPHEN 325 MG PO TABS: 650.0000 mg | ORAL_TABLET | ORAL | Status: DC | PRN

## 2016-11-08 MED ORDER — GABAPENTIN 300 MG PO CAPS
300.0000 mg | ORAL_CAPSULE | Freq: Three times a day (TID) | ORAL | Status: DC
  Administered 2016-11-08 – 2016-11-09 (×4): 300 mg via ORAL
  Filled 2016-11-08 (×3): qty 1

## 2016-11-08 NOTE — Progress Notes (Signed)
This Clinical research associatewriter spoke with pt about his discharge plans. Pt stated that he wanted to get back on his feet to continue affording his apartment. This Clinical research associatewriter gave pt resources for inpatient and outpatient substance abuse/mental health facilities around the area. This Clinical research associatewriter also gave pt substance abuse program pamphlets that may help him start working to afford his apartment. Pt was receptive to all communication between he and this Clinical research associatewriter.

## 2016-11-08 NOTE — ED Notes (Signed)
Pt discharged ambulatory with [place] driver.  All belongings were sent with patient.  Pt was calm and cooperative.

## 2016-11-08 NOTE — BH Assessment (Signed)
[facility] Assessment Progress Note  Per Thedore MinsMojeed Akintayo, MD, this pt would benefit from admission [facility] at this time.  Scott Heinrichina Tate, RN, [facility] pt to Obs 5.  Pt has signed Voluntary Admission and Consent for Treatment, as well as Consent to Release Information to [facility], his most recent provider, and signed forms have [facility].  Pt's nurse, Scott Hymendie, has been notified, and agrees to send original paperwork along with pt via [place] [place], and to call report to [phone] or [phone].  Scott Canninghomas Sebron Mcmahill, MA Triage Specialist [phone]
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
## 2016-11-09 DIAGNOSIS — R45850 Homicidal ideations: Secondary | ICD-10-CM

## 2016-11-09 DIAGNOSIS — F1024 Alcohol dependence with alcohol-induced mood disorder: Secondary | ICD-10-CM

## 2016-11-09 DIAGNOSIS — Z833 Family history of diabetes mellitus: Secondary | ICD-10-CM

## 2016-11-09 DIAGNOSIS — F4325 Adjustment disorder with mixed disturbance of emotions and conduct: Secondary | ICD-10-CM

## 2016-11-09 MED ORDER — GABAPENTIN 300 MG PO CAPS: 300.0000 mg | ORAL_CAPSULE | Freq: Three times a day (TID) | ORAL | 0 refills | Status: DC

## 2016-11-09 MED ORDER — NICOTINE 21 MG/24HR TD PT24: 21.0000 mg | MEDICATED_PATCH | Freq: Every day | TRANSDERMAL | 0 refills | Status: DC

## 2016-11-09 MED ORDER — HYDROXYZINE HCL 25 MG PO TABS: 25.0000 mg | ORAL_TABLET | Freq: Four times a day (QID) | ORAL | 0 refills | Status: DC | PRN

## 2016-11-09 MED ORDER — CARBAMAZEPINE ER 200 MG PO TB12: 200.0000 mg | ORAL_TABLET | Freq: Two times a day (BID) | ORAL | 0 refills | Status: DC

## 2016-11-09 MED ORDER — HYDROCHLOROTHIAZIDE 12.5 MG PO CAPS
12.5000 mg | ORAL_CAPSULE | Freq: Every day | ORAL | Status: DC
  Administered 2016-11-09: 12.5 mg via ORAL
  Filled 2016-11-09: qty 1
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
## 2016-11-09 NOTE — Progress Notes (Signed)
D: Patient seen watching TV. Presents with sad and depressed mood. Patient endorses depression 7/10. Denies pain, SI/HI, AH/VH. No behavioral issues noted.  A: Staff support and encourage patient as needed. Due meds given as ordered. Routine safety checks maintained. Will continue to monitor patient. R: Patient remains safe.
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
## 2016-11-09 NOTE — Progress Notes (Signed)
At 1445 pt discharged to home with personal belongings in hand as well as his AVS and Rx and pt education. Pt denied SI/HI/AVH and contracted for safety prior to leaving facility.

## 2016-11-21 ENCOUNTER — Emergency Department (HOSPITAL_COMMUNITY)
Admission: EM | Admit: 2016-11-21 | Discharge: 2016-11-21 | Disposition: A | Discharge status: Home / Self Care | Payer: Medicaid Other | Attending: Emergency Medicine | Admitting: Emergency Medicine

## 2016-11-21 ENCOUNTER — Encounter (HOSPITAL_COMMUNITY): Payer: Self-pay

## 2016-11-21 ENCOUNTER — Emergency Department (HOSPITAL_COMMUNITY): Payer: Medicaid Other

## 2016-11-21 DIAGNOSIS — Z87891 Personal history of nicotine dependence: Secondary | ICD-10-CM | POA: Insufficient documentation

## 2016-11-21 DIAGNOSIS — Y9389 Activity, other specified: Secondary | ICD-10-CM | POA: Insufficient documentation

## 2016-11-21 DIAGNOSIS — Y92009 Unspecified place in unspecified non-institutional (private) residence as the place of occurrence of the external cause: Secondary | ICD-10-CM | POA: Insufficient documentation

## 2016-11-21 DIAGNOSIS — Y999 Unspecified external cause status: Secondary | ICD-10-CM | POA: Insufficient documentation

## 2016-11-21 DIAGNOSIS — M79642 Pain in left hand: Secondary | ICD-10-CM
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
## 2016-11-21 NOTE — ED Notes (Signed)
No respiratory or acute distress noted resting in room with eyes closed call light in reach.

## 2016-11-21 NOTE — ED Notes (Signed)
Water given to pt per request.

## 2016-11-21 NOTE — Discharge Instructions (Signed)
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
## 2016-11-24 ENCOUNTER — Encounter (HOSPITAL_COMMUNITY): Payer: Self-pay | Admitting: Emergency Medicine

## 2016-11-24 ENCOUNTER — Emergency Department (HOSPITAL_COMMUNITY)
Admission: EM | Admit: 2016-11-24 | Discharge: 2016-11-25 | Disposition: A | Discharge status: Home / Self Care | Payer: Medicaid Other | Attending: Emergency Medicine | Admitting: Emergency Medicine

## 2016-11-24 DIAGNOSIS — E876 Hypokalemia: Secondary | ICD-10-CM | POA: Insufficient documentation

## 2016-11-24 DIAGNOSIS — I10 Essential (primary) hypertension: Secondary | ICD-10-CM

## 2016-11-24 DIAGNOSIS — R45851 Suicidal ideations: Secondary | ICD-10-CM

## 2016-11-24 DIAGNOSIS — Z87891 Personal history of nicotine dependence: Secondary | ICD-10-CM | POA: Insufficient documentation

## 2016-11-24 DIAGNOSIS — F4325 Adjustment disorder with mixed disturbance of emotions and conduct: Secondary | ICD-10-CM | POA: Diagnosis present

## 2016-11-24 DIAGNOSIS — R45850 Homicidal ideations: Secondary | ICD-10-CM

## 2016-11-24 DIAGNOSIS — Z79899 Other long term (current) drug therapy: Secondary | ICD-10-CM | POA: Insufficient documentation

## 2016-11-24 HISTORY — DX: Post-traumatic stress disorder, unspecified: F43.10

## 2016-11-24 HISTORY — DX: Essential (primary) hypertension: I10

## 2016-11-24 HISTORY — DX: Irritability and anger: R45.4

## 2016-11-24 HISTORY — DX: Major depressive disorder, single episode, unspecified: F32.9

## 2016-11-24 HISTORY — DX: Depression, unspecified: F32.A

## 2016-11-24 LAB — CBC
HEMATOCRIT: 39.1 % (ref 39.0–52.0)
Hemoglobin: 13.6 g/dL (ref 13.0–17.0)
MCH: 30.5 pg (ref 26.0–34.0)
MCHC: 34.8 g/dL (ref 30.0–36.0)
MCV: 87.7 fL (ref 78.0–100.0)
Platelets: 264 10*3/uL (ref 150–400)
RBC: 4.46 MIL/uL (ref 4.22–5.81)
RDW: 12.6 % (ref 11.5–15.5)
WBC: 4.9 10*3/uL (ref 4.0–10.5)

## 2016-11-24 LAB — ACETAMINOPHEN LEVEL

## 2016-11-24 LAB — COMPREHENSIVE METABOLIC PANEL
ALK PHOS: 95 U/L (ref 38–126)
ALT: 24 U/L (ref 17–63)
ANION GAP: 14 (ref 5–15)
AST: 33 U/L (ref 15–41)
Albumin: 5 g/dL (ref 3.5–5.0)
BILIRUBIN TOTAL: 0.7 mg/dL (ref 0.3–1.2)
BUN: 9 mg/dL (ref 6–20)
CALCIUM: 8.7 mg/dL — AB (ref 8.9–10.3)
CO2: 26 mmol/L (ref 22–32)
Chloride: 100 mmol/L — ABNORMAL LOW (ref 101–111)
Creatinine, Ser: 0.98 mg/dL (ref 0.61–1.24)
GLUCOSE: 91 mg/dL (ref 65–99)
POTASSIUM: 3 mmol/L — AB (ref 3.5–5.1)
Sodium: 140 mmol/L (ref 135–145)
TOTAL PROTEIN: 8.5 g/dL — AB (ref 6.5–8.1)

## 2016-11-24 LAB — RAPID URINE DRUG SCREEN, HOSP PERFORMED
Amphetamines: NOT DETECTED
BARBITURATES: NOT DETECTED
BENZODIAZEPINES: NOT DETECTED
COCAINE: NOT DETECTED
Opiates: NOT DETECTED
Tetrahydrocannabinol: NOT DETECTED

## 2016-11-24 LAB — ETHANOL: ALCOHOL ETHYL (B): 241 mg/dL — AB (ref <5)

## 2016-11-24 LAB — SALICYLATE LEVEL: Salicylate Lvl: <7 mg/dL (ref 2.8–30.0)

## 2016-11-24 MED ORDER — LORAZEPAM 1 MG PO TABS
2.0000 mg | ORAL_TABLET | Freq: Once | ORAL | Status: AC
  Administered 2016-11-24: 2 mg via ORAL
  Filled 2016-11-24: qty 2

## 2016-11-24 MED ORDER — LORAZEPAM 1 MG PO TABS
0.0000 mg | ORAL_TABLET | Freq: Four times a day (QID) | ORAL | Status: DC
  Administered 2016-11-25: 1 mg via ORAL
  Filled 2016-11-24: qty 1

## 2016-11-24 MED ORDER — DIPHENHYDRAMINE HCL 25 MG PO CAPS
50.0000 mg | ORAL_CAPSULE | Freq: Once | ORAL | Status: AC
  Administered 2016-11-24: 50 mg via ORAL
  Filled 2016-11-24: qty 2

## 2016-11-24 MED ORDER — LORAZEPAM 1 MG PO TABS: 0.0000 mg | ORAL_TABLET | Freq: Two times a day (BID) | ORAL | Status: DC

## 2016-11-24 MED ORDER — OLANZAPINE 10 MG PO TBDP
10.0000 mg | ORAL_TABLET | Freq: Once | ORAL | Status: AC
  Administered 2016-11-24: 10 mg via ORAL
  Filled 2016-11-24: qty 1

## 2016-11-24 MED ORDER — AMLODIPINE BESYLATE 5 MG PO TABS
10.0000 mg | ORAL_TABLET | Freq: Once | ORAL | Status: AC
  Administered 2016-11-24: 10 mg via ORAL
  Filled 2016-11-24: qty 2

## 2016-11-24 NOTE — ED Triage Notes (Signed)
Pt BIB 3 [employer].  Not IVC'd.  Pt yelling saying he is going to "fuck up the next mother fucker that talks to him".  Pt talking about cutting people's heads off.  Paranoid saying that there was a burgundy car following them here.  Pt wanting handcuffs off.
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
## 2016-11-24 NOTE — BH Assessment (Signed)
Attempted to assess Pt but he was too drowsy to open his eyes or respond to questions. TTS will assess Pt when he is able to participate.  Harlin RainFord Ellis Patsy BaltimoreWarrick Jr, LPC, [facility], [facility] Triage Specialist [phone]

## 2016-11-25 MED ORDER — NICOTINE 21 MG/24HR TD PT24: 21.0000 mg | MEDICATED_PATCH | Freq: Every day | TRANSDERMAL | Status: DC

## 2016-11-25 MED ORDER — CARBAMAZEPINE ER 200 MG PO TB12
200.0000 mg | ORAL_TABLET | Freq: Two times a day (BID) | ORAL | Status: DC
  Administered 2016-11-25: 200 mg via ORAL
  Filled 2016-11-25: qty 1

## 2016-11-25 MED ORDER — POTASSIUM CHLORIDE CRYS ER 20 MEQ PO TBCR
60.0000 meq | EXTENDED_RELEASE_TABLET | Freq: Once | ORAL | Status: AC
  Administered 2016-11-25: 60 meq via ORAL
  Filled 2016-11-25: qty 3

## 2016-11-25 MED ORDER — GABAPENTIN 300 MG PO CAPS
300.0000 mg | ORAL_CAPSULE | Freq: Three times a day (TID) | ORAL | Status: DC
  Administered 2016-11-25: 300 mg via ORAL
  Filled 2016-11-25: qty 1

## 2016-11-25 MED ORDER — POTASSIUM CHLORIDE CRYS ER 20 MEQ PO TBCR: 40.0000 meq | EXTENDED_RELEASE_TABLET | Freq: Once | ORAL | Status: DC

## 2016-11-25 NOTE — Discharge Instructions (Signed)
To help you maintain a sober lifestyle, a substance abuse treatment program may be beneficial to you.  Contact one of the following facilities at your earliest opportunity to ask about enrolling: ° °RESIDENTIAL PROGRAMS: ° °     [facility] °     [address] °     [place], [place] [address] °     [phone] ° °     [facility] °     [address] °     [place], [place] [address] °     [phone] ° °     [facility] °     [address] °     [place], [place] [address] °     [phone] ° °OUTPATIENT PROGRAMS: ° °     [facility] °     [address] °     [place], [place] [address] °     [phone] °     New patients are seen at the walk-in clinic every Tuesday from 9:00 am - 12:00 pm. °

## 2016-11-25 NOTE — ED Notes (Signed)
Belongings placed in locker room 32

## 2016-11-25 NOTE — ED Notes (Signed)
Admission Note  Pt at the time of assessment is resting in bed with eyes closed; Pt was difficult to arouse. Pt was not able to answer any assessment questions. Pt does not look to be in any distress. Support, encouragement, and safe environment provided.  15-minute safety checks continue.

## 2016-11-25 NOTE — BH Assessment (Signed)
[facility] Assessment Progress Note  Per Thedore MinsMojeed Akintayo, MD, this pt does not require psychiatric hospitalization at this time.  Pt presents under IVC initiated by EDP Shaune Pollackameron Isaacs, which Dr Jannifer FranklinAkintayo has rescinded.  [facility]  Discharge instructions advise pt to follow up with [facility], [facility], [facility], or [facility].  Pt's nurse, Diane, has been notified.  Doylene Canninghomas Hien Perreira, MA Triage Specialist [phone]
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
## 2016-11-25 NOTE — ED Notes (Signed)
Pt discharged home. Discharged instructions read to pt who verbalized understanding. All belongings returned to pt who signed for same. Denies SI/HI, is not delusional and not responding to internal stimuli. Escorted pt to the ED exit.    

## 2016-11-25 NOTE — [facility] Suicide Risk Assessment (Signed)
Suicide Risk Assessment  Discharge Assessment   [facility]   Principal Problem: Adjustment disorder with mixed disturbance of emotions and conduct Discharge Diagnoses:  Patient Active Problem List   Diagnosis Date Noted  . Adjustment disorder with mixed disturbance of emotions and conduct [F43.25] 11/08/2016    Priority: High  . Alcohol dependence with alcohol-induced mood disorder (HCC) [F10.24] 11/08/2016    Total Time spent with patient: 45 minutes  Musculoskeletal: Strength & Muscle Tone: within normal limits Gait & Station: normal Patient leans: NA  Psychiatric Specialty Exam:   Blood pressure 138/76, pulse 69, temperature 97.9 F (36.6 C), temperature source Oral, resp. rate 18, SpO2 98 %.There is no height or weight on file to calculate BMI.  General Appearance: Casual  Eye Contact::  Good  Speech:  Normal Rate409  Volume:  Normal  Mood:  Euthymic  Affect:  Congruent  Thought Process:  Coherent and Descriptions of Associations: Intact  Orientation:  Full (Time, Place, and Person)  Thought Content:  WDL  Suicidal Thoughts:  No  Homicidal Thoughts:  No  Memory:  Immediate;   Good Recent;   Good Remote;   Good  Judgement:  Fair  Insight:  Fair  Psychomotor Activity:  Normal  Concentration:  Good  Recall:  Fair  Fund of Knowledge:Fair  Language: Good  Akathisia:  No  Handed:  Right  AIMS (if indicated):     Assets:  Leisure Time Physical Health Resilience  Sleep:     Cognition: WNL  ADL's:  Intact   Mental Status Per Nursing Assessment::   On Admission:     Demographic Factors:  Male  Loss Factors: NA  Historical Factors: NA  Risk Reduction Factors:   Sense of responsibility to family and Positive social support  Continued Clinical Symptoms:  None  Cognitive Features That Contribute To Risk:  None    Suicide Risk:  Minimal: No identifiable suicidal ideation.  Patients presenting with no risk factors but with  morbid ruminations; may be classified as minimal risk based on the severity of the depressive symptoms    Plan Of Care/Follow-up recommendations:  Activity:  as tolerated  Diet:  heart healthy diet  LORD, JAMISON, NP 11/25/2016, 10:14 AM

## 2017-01-21 ENCOUNTER — Emergency Department (HOSPITAL_COMMUNITY)
Admission: EM | Admit: 2017-01-21 | Discharge: 2017-01-23 | Disposition: A | Discharge status: Home / Self Care | Payer: Medicaid Other | Attending: Emergency Medicine | Admitting: Emergency Medicine

## 2017-01-21 ENCOUNTER — Emergency Department (HOSPITAL_COMMUNITY)
Admission: EM | Admit: 2017-01-21 | Discharge: 2017-01-21 | Disposition: A | Discharge status: Home / Self Care | Payer: Self-pay | Attending: Emergency Medicine | Admitting: Emergency Medicine

## 2017-01-21 ENCOUNTER — Encounter (HOSPITAL_COMMUNITY): Payer: Self-pay | Admitting: Emergency Medicine

## 2017-01-21 ENCOUNTER — Emergency Department (HOSPITAL_COMMUNITY): Payer: Self-pay

## 2017-01-21 DIAGNOSIS — Z87891 Personal history of nicotine dependence: Secondary | ICD-10-CM | POA: Insufficient documentation

## 2017-01-21 DIAGNOSIS — Z79899 Other long term (current) drug therapy: Secondary | ICD-10-CM | POA: Insufficient documentation

## 2017-01-21 DIAGNOSIS — R45851 Suicidal ideations: Secondary | ICD-10-CM

## 2017-01-21 DIAGNOSIS — I10 Essential (primary) hypertension: Secondary | ICD-10-CM | POA: Insufficient documentation

## 2017-01-21 DIAGNOSIS — F1024 Alcohol dependence with alcohol-induced mood disorder: Secondary | ICD-10-CM | POA: Diagnosis present

## 2017-01-21 DIAGNOSIS — R440 Auditory hallucinations: Secondary | ICD-10-CM | POA: Insufficient documentation

## 2017-01-21 DIAGNOSIS — F4325 Adjustment disorder with mixed disturbance of emotions and conduct: Secondary | ICD-10-CM | POA: Insufficient documentation

## 2017-01-21 DIAGNOSIS — B349 Viral infection, unspecified: Secondary | ICD-10-CM | POA: Insufficient documentation

## 2017-01-21 LAB — CBC
HCT: 40.6 % (ref 39.0–52.0)
HEMOGLOBIN: 14.5 g/dL (ref 13.0–17.0)
MCH: 31.5 pg (ref 26.0–34.0)
MCHC: 35.7 g/dL (ref 30.0–36.0)
MCV: 88.3 fL (ref 78.0–100.0)
Platelets: 247 10*3/uL (ref 150–400)
RBC: 4.6 MIL/uL (ref 4.22–5.81)
RDW: 12.7 % (ref 11.5–15.5)
WBC: 5.2 10*3/uL (ref 4.0–10.5)

## 2017-01-21 LAB — COMPREHENSIVE METABOLIC PANEL
ALBUMIN: 4.8 g/dL (ref 3.5–5.0)
ALT: 31 U/L (ref 17–63)
ANION GAP: 13 (ref 5–15)
AST: 47 U/L — ABNORMAL HIGH (ref 15–41)
Alkaline Phosphatase: 131 U/L — ABNORMAL HIGH (ref 38–126)
BUN: 11 mg/dL (ref 6–20)
CHLORIDE: 100 mmol/L — AB (ref 101–111)
CO2: 24 mmol/L (ref 22–32)
Calcium: 9.2 mg/dL (ref 8.9–10.3)
Creatinine, Ser: 1.05 mg/dL (ref 0.61–1.24)
GFR calc non Af Amer: >60 mL/min (ref >60)
Glucose, Bld: 95 mg/dL (ref 65–99)
Potassium: 3.1 mmol/L — ABNORMAL LOW (ref 3.5–5.1)
SODIUM: 137 mmol/L (ref 135–145)
Total Bilirubin: 1 mg/dL (ref 0.3–1.2)
Total Protein: 8.9 g/dL — ABNORMAL HIGH (ref 6.5–8.1)

## 2017-01-21 LAB — SALICYLATE LEVEL

## 2017-01-21 LAB — ACETAMINOPHEN LEVEL

## 2017-01-21 LAB — ETHANOL: Alcohol, Ethyl (B): 47 mg/dL — ABNORMAL HIGH (ref <5)

## 2017-01-21 MED ORDER — HYDROCHLOROTHIAZIDE 12.5 MG PO CAPS
25.0000 mg | ORAL_CAPSULE | Freq: Every day | ORAL | Status: DC
  Administered 2017-01-21: 25 mg via ORAL
  Filled 2017-01-21: qty 2

## 2017-01-21 MED ORDER — HYDROCHLOROTHIAZIDE 25 MG PO TABS: 25.0000 mg | ORAL_TABLET | Freq: Every day | ORAL | 0 refills | Status: DC

## 2017-01-21 MED ORDER — ONDANSETRON HCL 4 MG PO TABS: 4.0000 mg | ORAL_TABLET | Freq: Three times a day (TID) | ORAL | Status: DC | PRN

## 2017-01-21 MED ORDER — LORAZEPAM 1 MG PO TABS: 1.0000 mg | ORAL_TABLET | Freq: Three times a day (TID) | ORAL | Status: DC | PRN

## 2017-01-21 MED ORDER — IBUPROFEN 800 MG PO TABS
800.0000 mg | ORAL_TABLET | Freq: Once | ORAL | Status: AC
  Administered 2017-01-21: 800 mg via ORAL
  Filled 2017-01-21: qty 1

## 2017-01-21 MED ORDER — ONDANSETRON HCL 4 MG PO TABS: 4.0000 mg | ORAL_TABLET | Freq: Four times a day (QID) | ORAL | 0 refills | Status: DC

## 2017-01-21 MED ORDER — GUAIFENESIN 100 MG/5ML PO LIQD: 100.0000 mg | ORAL | 0 refills | Status: DC | PRN

## 2017-01-21 NOTE — ED Triage Notes (Signed)
Per pt, states he started having cold symptoms-body aches, congestion-has not taking BP meds in awhile

## 2017-01-21 NOTE — ED Notes (Signed)
Bed: [id number] Expected date:  Expected time:  Means of arrival:  Comments: [employer]
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
## 2017-01-21 NOTE — Discharge Instructions (Addendum)
Please read and follow all provided instructions.  Your diagnoses today include:  1. Viral syndrome   2. Hypertension, unspecified type     Tests performed today include: Vital signs. See below for your results today.   Medications prescribed:  Take as prescribed   Home care instructions:  Follow any educational materials contained in this packet.  Follow-up instructions: Please follow-up with your primary care provider for further evaluation of symptoms and treatment   Return instructions:  Please return to the Emergency Department if you do not get better, if you get worse, or new symptoms OR  - Fever (temperature greater than 101.49F)  - Bleeding that does not stop with holding pressure to the area    -Severe pain (please note that you may be more sore the day after your accident)  - Chest Pain  - Difficulty breathing  - Severe nausea or vomiting  - Inability to tolerate food and liquids  - Passing out  - Skin becoming red around your wounds  - Change in mental status (confusion or lethargy)  - New numbness or weakness    Please return if you have any other emergent concerns.  Additional Information:  Your vital signs today were: BP (!) 172/122    Pulse 71    Temp 98.7 F (37.1 C)    Resp 16    SpO2 98%  If your blood pressure (BP) was elevated above 135/85 this visit, please have this repeated by your doctor within one month. ---------------

## 2017-01-21 NOTE — ED Triage Notes (Signed)
Patient was brought in by [employer]. Patient was trying take a lot of sleeping pills and abilify trying to kill himself. Patient states that he does not want to live.

## 2017-01-21 NOTE — ED Provider Notes (Signed)
[facility]-EMERGENCY DEPT Provider Note   CSN: [id number] Arrival date & time: 01/21/17  2205   By signing my name below, I, Scott Fischer, attest that this documentation has been prepared under the direction and in the presence of Scott Anis, PA-C Electronically Signed: Soijett Fischer, ED Scribe. 01/21/17. 11:46 PM.  History   Chief Complaint Chief Complaint  Patient presents with  . Suicide Attempt    HPI Scott Fischer Scott Fischer is a 41 y.o. male with a PMHx of difficulty controlling anger, PTSD, depression, HTN, who presents to the Emergency Department [facility] complaining of suicide attempt onset PTA. Pt notes that he hasn't taken his psych medications in 3 days in fear of taking too many. Per [facility] report, pt was brought in for evaluation due to attempting to take sleeping pills and abilify in order to kill himself. Pt reports associated fleeting HI and commanding auditory hallucinations. He states that his auditory hallucinations are commanding him to overdose. He hasn't tried any medications for the relief of his symptoms. He denies fever, chills, and any other symptoms.   The history is provided by the patient and the police. No language interpreter was used.    Past Medical History:  Diagnosis Date  . Depression   . Difficulty controlling anger   . Hypertension   . PTSD (post-traumatic stress disorder)     Patient Active Problem List   Diagnosis Date Noted  . 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 Alcohol dependence with alcohol-induced mood disorder (HCC) 11/08/2016    History reviewed. No pertinent surgical history.     Home Medications    Prior to Admission medications   Medication Sig Start Date End Date Taking? Authorizing Provider  ARIPiprazole (ABILIFY) 10 MG tablet Take 10 mg by mouth at bedtime.   Yes Historical Provider, MD  carbamazepine (TEGRETOL XR) 200 MG 12 hr tablet Take 1 tablet (200 mg total) by mouth 2 (two)  times daily. 11/09/16  Yes Scott Abbe, NP  gabapentin (NEURONTIN) 300 MG capsule Take 1 capsule (300 mg total) by mouth 3 (three) times daily. 11/09/16  Yes Scott Abbe, NP  hydrOXYzine (ATARAX/VISTARIL) 25 MG tablet Take 1 tablet (25 mg total) by mouth every 6 (six) hours as needed for anxiety (or CIWA score </= 10). 11/09/16  Yes Scott Abbe, NP  traZODone (DESYREL) 50 MG tablet Take 50 mg by mouth at bedtime.   Yes Historical Provider, MD  guaiFENesin (ROBITUSSIN) 100 MG/5ML liquid Take 5-10 mLs (100-200 mg total) by mouth every 4 (four) hours as needed for cough. Patient not taking: Reported on 01/21/2017 01/21/17   Scott Pili, PA-C  hydrochlorothiazide (HYDRODIURIL) 25 MG tablet Take 1 tablet (25 mg total) by mouth daily. Patient not taking: Reported on 01/21/2017 01/21/17   Scott Pili, PA-C  nicotine (NICODERM CQ - DOSED IN MG/24 HOURS) 21 mg/24hr patch Place 1 patch (21 mg total) onto the skin daily. Patient not taking: Reported on 01/21/2017 11/10/16   Scott Abbe, NP  ondansetron (ZOFRAN) 4 MG tablet Take 1 tablet (4 mg total) by mouth every 6 (six) hours. Patient not taking: Reported on 01/21/2017 01/21/17   Scott Pili, PA-C    Family History Family History  Problem Relation Age of Onset  . Diabetes Mother     Social History Social History  Substance Use Topics  . Smoking status: Former Games developer  . Smokeless tobacco: Never Used  . Alcohol use Yes  Comment: 6 pack daily     Allergies   Patient has no known allergies.   Review of Systems Review of Systems  Constitutional: Negative for chills and fever.  Respiratory: Negative.   Cardiovascular: Negative.   Gastrointestinal: Negative.   Musculoskeletal: Negative.   Psychiatric/Behavioral: Positive for hallucinations (auditory) and suicidal ideas.       +fleeting HI  All other systems reviewed and are negative.    Physical Exam Updated Vital Signs BP (!) 194/121 (BP Location: Left  Arm)   Pulse 71   Temp 98.1 F (36.7 C) (Oral)   Resp 17   Ht 6' (1.829 m)   Wt 225 lb (102.1 kg)   SpO2 100%   BMI 30.52 kg/m   Physical Exam  Constitutional: He is oriented to person, place, and time. He appears well-developed and well-nourished. No distress.  HENT:  Head: Normocephalic and atraumatic.  Eyes: EOM are normal.  Neck: Neck supple.  Cardiovascular: Normal rate, regular rhythm and normal heart sounds.  Exam reveals no gallop and no friction rub.   No murmur heard. Pulmonary/Chest: Effort normal and breath sounds normal. No respiratory distress. He has no wheezes. He has no rales.  Abdominal: Soft. He exhibits no distension. There is no tenderness.  Musculoskeletal: Normal range of motion.  Neurological: He is alert and oriented to person, place, and time.  Skin: Skin is warm and dry.  Psychiatric: He has a normal mood and affect. His behavior is normal. He is actively hallucinating. He expresses homicidal and suicidal ideation. He expresses suicidal plans.  Nursing note and vitals reviewed.    ED Treatments / Results  DIAGNOSTIC STUDIES: Oxygen Saturation is 100% on RA, nl by my interpretation.    COORDINATION OF CARE: 11:44 PM Discussed treatment plan with pt at bedside which includes labs, UDS, and pt agreed to plan.   Labs (all labs ordered are listed, but only abnormal results are displayed) Labs Reviewed  COMPREHENSIVE METABOLIC PANEL - Abnormal; Notable for the following:       Result Value   Potassium 3.1 (*)    Chloride 100 (*)    Total Protein 8.9 (*)    AST 47 (*)    Alkaline Phosphatase 131 (*)    All other components within normal limits  ETHANOL - Abnormal; Notable for the following:    Alcohol, Ethyl (B) 47 (*)    All other components within normal limits  ACETAMINOPHEN LEVEL - Abnormal; Notable for the following:    Acetaminophen (Tylenol), Serum <10 (*)    All other components within normal limits  SALICYLATE LEVEL  CBC  RAPID  URINE DRUG SCREEN, HOSP PERFORMED    EKG  EKG Interpretation None       Radiology Dg Chest 2 View  Result Date: 01/21/2017 CLINICAL DATA:  Dry cough for 4 days. EXAM: CHEST  2 VIEW COMPARISON:  None FINDINGS: The heart size and mediastinal contours are within normal limits. Both lungs are clear. The visualized skeletal structures are unremarkable. IMPRESSION: No active cardiopulmonary disease. Electronically Signed   By: Signa Kell M.D.   On: 01/21/2017 19:03    Procedures Procedures (including critical care time)  Medications Ordered in ED Medications  LORazepam (ATIVAN) tablet 1 mg (not administered)  ondansetron (ZOFRAN) tablet 4 mg (not administered)     Initial Impression / Assessment and Plan / ED Course  I have reviewed the triage vital signs and the nursing notes.  Pertinent labs & imaging results that were available  during my care of the patient were reviewed by me and considered in my medical decision making (see chart for details).     Patient presents with SI, afraid he will overdose, and auditory hallucinations that are command voices. TTS consultation requested. AM psych evaluation recommended.   Final Clinical Impressions(s) / ED Diagnoses   Final diagnoses:  None  1. SI 2. Auditory hallucination  New Prescriptions New Prescriptions   No medications on file   I personally performed the services described in this documentation, which was scribed in my presence. The recorded information has been reviewed and is accurate.      Scott AnisShari Kam Rahimi, PA-C 01/22/17 [id number]    Shon Batonourtney F Horton, MD 01/23/17 [phone]

## 2017-01-21 NOTE — ED Provider Notes (Signed)
[facility]-EMERG[facility]Y DEPT Provider Note   By signing my name below, I, Earmon Phoenix, attest that this documentation has been prepared under the direction and in the presence of [name], PA-C. Electronically Signed: Earmon Phoenix, ED Scribe. 01/21/17. 7:06 PM.  History   Chief Complaint Chief Complaint  Patient presents with  . URI   HPI  Scott Fischer is a 41 y.o. male with PMHx of HTN who presents to the Emergency Department complaining of URI symptoms that began two days ago. He reports associated cough, sneezing, SOB, diarrhea, vomiting, nausea, congestion, chills and generalized body aches. He has not taken anything for pain relief. He denies modifying factors. He denies fever. He does not have a PCP.  Past Medical History:  Diagnosis Date  . Depression   . Difficulty controlling anger   . Hypertension   . PTSD (post-traumatic stress disorder)     Patient Active Problem List   Diagnosis Date Noted  . 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 Alcohol dependence with alcohol-induced mood disorder (HCC) 11/08/2016    History reviewed. No pertinent surgical history.     Home Medications    Prior to Admission medications   Medication Sig Start Date End Date Taking? Authorizing Provider  carbamazepine (TEGRETOL XR) 200 MG 12 hr tablet Take 1 tablet (200 mg total) by mouth 2 (two) times daily. 11/09/16   Laveda Abbe, NP  gabapentin (NEURONTIN) 300 MG capsule Take 1 capsule (300 mg total) by mouth 3 (three) times daily. 11/09/16   Laveda Abbe, NP  hydrOXYzine (ATARAX/VISTARIL) 25 MG tablet Take 1 tablet (25 mg total) by mouth every 6 (six) hours as needed for anxiety (or CIWA score </= 10). 11/09/16   Laveda Abbe, NP  nicotine (NICODERM CQ - DOSED IN MG/24 HOURS) 21 mg/24hr patch Place 1 patch (21 mg total) onto the skin daily. 11/10/16   Laveda Abbe, NP    Family History Family History    Problem Relation Age of Onset  . Diabetes Mother     Social History Social History  Substance Use Topics  . Smoking status: Former Games developer  . Smokeless tobacco: Never Used  . Alcohol use Yes     Comment: 6 pack daily     Allergies   Patient has no known allergies.   Review of Systems Review of Systems  A complete 10 system review of systems was obtained and all systems are negative except as noted in the HPI and PMH.   Physical Exam Updated Vital Signs BP (!) 172/122   Pulse 71   Temp 98.7 F (37.1 C)   Resp 16   SpO2 98%   Physical Exam  Constitutional: He is oriented to person, place, and time. He appears well-developed and well-nourished. No distress.  HENT:  Head: Normocephalic and atraumatic.  Right Ear: Tympanic membrane, external ear and ear canal normal.  Left Ear: Tympanic membrane, external ear and ear canal normal.  Nose: Nose normal.  Mouth/Throat: Uvula is midline, oropharynx is clear and moist and mucous membranes are normal. No trismus in the jaw. No oropharyngeal exudate, posterior oropharyngeal erythema or tonsillar abscesses.  Eyes: EOM are normal. Pupils are equal, round, and reactive to light.  Neck: Normal range of motion. Neck supple. No tracheal deviation present.  Cardiovascular: Normal rate, regular rhythm, S1 normal, S2 normal, normal heart sounds, intact distal pulses and normal pulses.   Pulmonary/Chest: Effort normal and breath sounds normal. No respiratory  distress. He has no decreased breath sounds. He has no wheezes. He has no rhonchi. He has no rales.  Abdominal: Normal appearance and bowel sounds are normal. There is no tenderness.  Musculoskeletal: Normal range of motion.  Neurological: He is alert and oriented to person, place, and time.  Skin: Skin is warm and dry.  Psychiatric: He has a normal mood and affect. His speech is normal and behavior is normal. Thought content normal.   ED Treatments / Results  DIAGNOSTIC  STUDIES: Oxygen Saturation is 98% on RA, normal by my interpretation.   COORDINATION OF CARE: 6:29 PM- Will order CXR. Pt verbalizes understanding and agrees to plan.  Medications - No data to display  Labs (all labs ordered are listed, but only abnormal results are displayed) Labs Reviewed - No data to display  EKG  EKG Interpretation None      Radiology Dg Chest 2 View  Result Date: 01/21/2017 CLINICAL DATA:  Dry cough for 4 days. EXAM: CHEST  2 VIEW COMPARISON:  None FINDINGS: The heart size and mediastinal contours are within normal limits. Both lungs are clear. The visualized skeletal structures are unremarkable. IMPRESSION: No active cardiopulmonary disease. Electronically Signed   By: Signa Kellaylor  Stroud M.D.   On: 01/21/2017 19:03    Procedures Procedures (including critical care time)  Medications Ordered in ED Medications - No data to display   Initial Impression / Assessment and Plan / ED Course  I have reviewed the triage vital signs and the nursing notes.  Pertinent labs & imaging results that were available during my care of the patient were reviewed by me and considered in my medical decision making (see chart for details).    Final Clinical Impressions(s) / ED Diagnoses  I have reviewed and evaluated the relevant laboratory values. I have reviewed and evaluated the relevant imaging studies. I have reviewed the relevant previous healthcare records. I obtained HPI from historian.  ED Course:  Assessment: Pt is a 41 y.o. male presents with URI symptoms including cough, sneezing, SOB, diarrhea, vomiting, nausea, congestion, chills and generalized body aches. On exam, pt in NAD. VSS. Afebrile. Lungs CTA, Heart RRR. Abdomen nontender/soft. Pt CXR negative for acute infiltrate. Patients symptoms are consistent with URI, likely viral etiology. Discussed that antibiotics are not indicated for viral infections. Given Rx HCTZ for BP. Close follow up with PCP for recheck. Pt  will be discharged with symptomatic treatment. Verbalizes understanding and is agreeable with plan. Pt is hemodynamically stable & in NAD prior to dc.  Disposition/Plan:  D/c home Additional Verbal discharge instructions given and discussed with patient.  Pt Instructed to f/u with [facility] in the next week for evaluation and treatment of symptoms. Return precautions given Pt acknowledges and agrees with plan  Supervising Physician Azalia BilisKevin Campos, MD   Final diagnoses:  Viral syndrome  Hypertension, unspecified type   I personally performed the services described in this documentation, which was scribed in my presence. The recorded information has been reviewed and is accurate.  New Prescriptions New Prescriptions   No medications on file     Audry Piliyler Keeleigh Terris, PA-C 01/21/17 1923    Azalia BilisKevin Campos, MD 01/22/17 [phone]

## 2017-01-21 NOTE — ED Notes (Signed)
Patient unable to provide a urine sample at this time

## 2017-01-21 NOTE — ED Notes (Signed)
Bed: [facility]28 Expected date:  Expected time:  Means of arrival:  Comments: 

## 2017-01-22 DIAGNOSIS — F1024 Alcohol dependence with alcohol-induced mood disorder: Secondary | ICD-10-CM

## 2017-01-22 DIAGNOSIS — F1290 Cannabis use, unspecified, uncomplicated: Secondary | ICD-10-CM

## 2017-01-22 DIAGNOSIS — Z79899 Other long term (current) drug therapy: Secondary | ICD-10-CM

## 2017-01-22 DIAGNOSIS — F4325 Adjustment disorder with mixed disturbance of emotions and conduct: Secondary | ICD-10-CM

## 2017-01-22 DIAGNOSIS — R45851 Suicidal ideations: Secondary | ICD-10-CM

## 2017-01-22 DIAGNOSIS — Z87891 Personal history of nicotine dependence: Secondary | ICD-10-CM

## 2017-01-22 LAB — RAPID URINE DRUG SCREEN, HOSP PERFORMED
Amphetamines: NOT DETECTED
BARBITURATES: NOT DETECTED
Benzodiazepines: NOT DETECTED
COCAINE: NOT DETECTED
Opiates: NOT DETECTED
TETRAHYDROCANNABINOL: NOT DETECTED

## 2017-01-22 MED ORDER — ARIPIPRAZOLE 10 MG PO TABS
10.0000 mg | ORAL_TABLET | Freq: Every day | ORAL | Status: DC
  Administered 2017-01-22: 10 mg via ORAL

## 2017-01-22 MED ORDER — ARIPIPRAZOLE 10 MG PO TABS
10.0000 mg | ORAL_TABLET | Freq: Every day | ORAL | Status: DC
  Filled 2017-01-22: qty 1

## 2017-01-22 MED ORDER — CARBAMAZEPINE ER 200 MG PO TB12: 200.0000 mg | ORAL_TABLET | Freq: Two times a day (BID) | ORAL | Status: DC

## 2017-01-22 MED ORDER — TRAZODONE HCL 50 MG PO TABS
50.0000 mg | ORAL_TABLET | Freq: Every day | ORAL | Status: DC
  Filled 2017-01-22: qty 1

## 2017-01-22 MED ORDER — GABAPENTIN 300 MG PO CAPS
300.0000 mg | ORAL_CAPSULE | Freq: Three times a day (TID) | ORAL | Status: DC
  Administered 2017-01-22 (×3): 300 mg via ORAL
  Filled 2017-01-22 (×3): qty 1

## 2017-01-22 MED ORDER — HYDROXYZINE HCL 25 MG PO TABS: 25.0000 mg | ORAL_TABLET | Freq: Four times a day (QID) | ORAL | Status: DC | PRN

## 2017-01-22 MED ORDER — HYDROCHLOROTHIAZIDE 25 MG PO TABS
25.0000 mg | ORAL_TABLET | Freq: Every day | ORAL | Status: DC
  Administered 2017-01-22: 25 mg via ORAL
  Filled 2017-01-22 (×2): qty 1

## 2017-01-22 MED ORDER — TRAZODONE HCL 50 MG PO TABS
50.0000 mg | ORAL_TABLET | Freq: Every evening | ORAL | Status: DC | PRN
  Administered 2017-01-22: 50 mg via ORAL

## 2017-01-22 MED ORDER — CARBAMAZEPINE ER 200 MG PO TB12
200.0000 mg | ORAL_TABLET | Freq: Two times a day (BID) | ORAL | Status: DC
  Administered 2017-01-22 (×2): 200 mg via ORAL
  Filled 2017-01-22 (×2): qty 1

## 2017-01-22 MED ORDER — GABAPENTIN 300 MG PO CAPS: 300.0000 mg | ORAL_CAPSULE | Freq: Three times a day (TID) | ORAL | Status: DC

## 2017-01-22 MED ORDER — ONDANSETRON HCL 4 MG PO TABS
4.0000 mg | ORAL_TABLET | Freq: Four times a day (QID) | ORAL | Status: DC
  Administered 2017-01-22 (×2): 4 mg via ORAL
  Filled 2017-01-22 (×2): qty 1

## 2017-01-22 MED ORDER — ACETAMINOPHEN 325 MG PO TABS
650.0000 mg | ORAL_TABLET | ORAL | Status: DC | PRN
  Administered 2017-01-22: 650 mg via ORAL
  Filled 2017-01-22: qty 2
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
## 2017-01-23 MED ORDER — HYDROXYZINE HCL 25 MG PO TABS: 25.0000 mg | ORAL_TABLET | Freq: Four times a day (QID) | ORAL | 0 refills | Status: DC | PRN

## 2017-01-23 MED ORDER — CARBAMAZEPINE ER 200 MG PO TB12: 200.0000 mg | ORAL_TABLET | Freq: Two times a day (BID) | ORAL | 0 refills | Status: DC

## 2017-01-23 MED ORDER — TRAZODONE HCL 50 MG PO TABS: 50.0000 mg | ORAL_TABLET | Freq: Every day | ORAL | 0 refills | Status: DC

## 2017-01-23 MED ORDER — GABAPENTIN 300 MG PO CAPS: 300.0000 mg | ORAL_CAPSULE | Freq: Three times a day (TID) | ORAL | 0 refills | Status: AC

## 2017-01-23 MED ORDER — ARIPIPRAZOLE 10 MG PO TABS: 10.0000 mg | ORAL_TABLET | Freq: Every day | ORAL | 0 refills | Status: DC

## 2017-01-23 NOTE — ED Notes (Signed)
Pt stated "I don't have a place to live.  I've been living out and don't like to take my medicine because it makes me sleep too hard.  I just got a call and said I could come back to my job and supposed to start Thursday.  If I call my mama, she'll tell me to come back to [place] but I can't.  My brother was murdered and if I'm there, I'll end up going to jail.  I have to pay child support.  I have a 43 y/o and 43 y/o.  I still pay for the 43 y/o for back pay.  I think I'm going to call my work today and tell them I'm going to have to go somewhere for 30 days."

## 2017-01-23 NOTE — Discharge Instructions (Signed)
For your ongoing mental health needs, you are advised to follow up with [facility].  New and returning patients are seen at their walk-in clinic.  Walk-in hours are Monday - Friday from 8:00 am - 3:00 pm.  Walk-in patients are seen on a first come, first served basis.  Try to arrive as early as possible for he best chance of being seen the same day: ° °     [facility] °     [address] °     [place], [place] [address] °     [phone] °
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
## 2017-01-23 NOTE — [facility] Assessment (Signed)
[facility] Assessment Progress Note  Per Mojeed Akintayo, MD, this pt does not require psychiatric hospitalization at this time.  Pt is to be discharged from [facility] with referral information for [facility].  This has been included in pt's discharge instructions.  Pt's nurse has been notified.  Mikela Senn, MA Triage Specialist [phone]     

## 2017-01-23 NOTE — [facility] Suicide Risk Assessment (Signed)
Suicide Risk Assessment  Discharge Assessment   [facility]   Principal Problem: Alcohol dependence with alcohol-induced mood disorder [facility](HCC) Discharge Diagnoses:  Patient Active Problem List   Diagnosis Date Noted  . Adjustment disorder with mixed disturbance of emotions and conduct [F43.25] 11/08/2016    Priority: High  . Alcohol dependence with alcohol-induced mood disorder (HCC) [F10.24] 11/08/2016    Priority: High    Total Time spent with patient: 45 minutes   Musculoskeletal: Strength & Muscle Tone: within normal limits Gait & Station: normal Patient leans: N/A  Psychiatric Specialty Exam: Physical Exam  Constitutional: He is oriented to person, place, and time. He appears well-developed and well-nourished.  HENT:  Head: Normocephalic.  Neck: Normal range of motion.  Respiratory: Effort normal.  Musculoskeletal: Normal range of motion.  Neurological: He is alert and oriented to person, place, and time.  Psychiatric: He has a normal mood and affect. His speech is normal and behavior is normal. Judgment and thought content normal. Cognition and memory are normal.    Review of Systems  Psychiatric/Behavioral: Positive for substance abuse.  All other systems reviewed and are negative.   Blood pressure 145/85, pulse 82, temperature 98.2 F (36.8 C), temperature source Oral, resp. rate 20, height 6' (1.829 m), weight 102.1 kg (225 lb), SpO2 100 %.Body mass index is 30.52 kg/m.  General Appearance: Casual  Eye Contact:  Good  Speech:  Normal Rate  Volume:  Normal  Mood:  Euthymic  Affect:  Congruent  Thought Process:  Coherent and Descriptions of Associations: Intact  Orientation:  Full (Time, Place, and Person)  Thought Content:  WDL and Logical  Suicidal Thoughts:  No  Homicidal Thoughts:  No  Memory:  Immediate;   Good Recent;   Good Remote;   Good  Judgement:  Fair  Insight:  Fair  Psychomotor Activity:  Normal  Concentration:   Concentration: Good and Attention Span: Good  Recall:  Good  Fund of Knowledge:  Fair  Language:  Good  Akathisia:  No  Handed:  Right  AIMS (if indicated):     Assets:  Leisure Time Physical Health Resilience  ADL's:  Intact  Cognition:  WNL  Sleep:      Mental Status Per Nursing Assessment::   On Admission:   alcohol abuse with suicidal ideations  Demographic Factors:  Male  Loss Factors: NA  Historical Factors: NA  Risk Reduction Factors:   Sense of responsibility to family and Positive social support  Continued Clinical Symptoms:  None  Cognitive Features That Contribute To Risk:  None    Suicide Risk:  Minimal: No identifiable suicidal ideation.  Patients presenting with no risk factors but with morbid ruminations; may be classified as minimal risk based on the severity of the depressive symptoms    Plan Of Care/Follow-up recommendations:  Activity:  as tolerated Diet:  heart healthy diet  Yanessa Hocevar, NP 01/23/2017, 9:54 AM

## 2017-01-29 ENCOUNTER — Encounter (HOSPITAL_COMMUNITY): Payer: Self-pay

## 2017-01-29 ENCOUNTER — Emergency Department (HOSPITAL_COMMUNITY)
Admission: EM | Admit: 2017-01-29 | Discharge: 2017-01-30 | Disposition: A | Discharge status: Home / Self Care | Payer: Medicaid Other

## 2017-01-29 DIAGNOSIS — I10 Essential (primary) hypertension: Secondary | ICD-10-CM | POA: Insufficient documentation

## 2017-01-29 DIAGNOSIS — Z79899 Other long term (current) drug therapy: Secondary | ICD-10-CM | POA: Insufficient documentation

## 2017-01-29 DIAGNOSIS — Z87891 Personal history of nicotine dependence: Secondary | ICD-10-CM | POA: Insufficient documentation

## 2017-01-29 DIAGNOSIS — F1024 Alcohol dependence with alcohol-induced mood disorder: Secondary | ICD-10-CM | POA: Diagnosis present

## 2017-01-29 HISTORY — DX: Other symptoms and signs involving appearance and behavior: R46.89

## 2017-01-29 HISTORY — DX: Other symptoms and signs involving emotional state: R45.89

## 2017-01-29 LAB — COMPREHENSIVE METABOLIC PANEL
ALK PHOS: 113 U/L (ref 38–126)
ALT: 21 U/L (ref 17–63)
AST: 33 U/L (ref 15–41)
Albumin: 4.6 g/dL (ref 3.5–5.0)
Anion gap: 11 (ref 5–15)
BUN: 16 mg/dL (ref 6–20)
CALCIUM: 8.7 mg/dL — AB (ref 8.9–10.3)
CO2: 27 mmol/L (ref 22–32)
CREATININE: 0.99 mg/dL (ref 0.61–1.24)
Chloride: 103 mmol/L (ref 101–111)
Glucose, Bld: 86 mg/dL (ref 65–99)
Potassium: 3.4 mmol/L — ABNORMAL LOW (ref 3.5–5.1)
Sodium: 141 mmol/L (ref 135–145)
Total Bilirubin: 0.4 mg/dL (ref 0.3–1.2)
Total Protein: 8.3 g/dL — ABNORMAL HIGH (ref 6.5–8.1)

## 2017-01-29 LAB — RAPID URINE DRUG SCREEN, HOSP PERFORMED
Amphetamines: NOT DETECTED
Barbiturates: NOT DETECTED
Benzodiazepines: NOT DETECTED
Cocaine: NOT DETECTED
OPIATES: NOT DETECTED
Tetrahydrocannabinol: NOT DETECTED

## 2017-01-29 LAB — CBG MONITORING, ED: Glucose-Capillary: 87 mg/dL (ref 65–99)

## 2017-01-29 LAB — CBC
HCT: 39 % (ref 39.0–52.0)
Hemoglobin: 13.7 g/dL (ref 13.0–17.0)
MCH: 31.4 pg (ref 26.0–34.0)
MCHC: 35.1 g/dL (ref 30.0–36.0)
MCV: 89.4 fL (ref 78.0–100.0)
PLATELETS: 189 10*3/uL (ref 150–400)
RBC: 4.36 MIL/uL (ref 4.22–5.81)
RDW: 12.7 % (ref 11.5–15.5)
WBC: 3.8 10*3/uL — ABNORMAL LOW (ref 4.0–10.5)

## 2017-01-29 LAB — ETHANOL: ALCOHOL ETHYL (B): 138 mg/dL — AB (ref <5)

## 2017-01-29 LAB — ACETAMINOPHEN LEVEL: Acetaminophen (Tylenol), Serum: <10 ug/mL — ABNORMAL LOW (ref 10–30)

## 2017-01-29 LAB — SALICYLATE LEVEL

## 2017-01-29 NOTE — ED Notes (Signed)
Pt says it was dark and he does not know how much or how many meds he took. Pt arrived with these pill bottles.  Trazadone, 50mg  tab, filled 01/11/17, qty 30. 25 tabs left Carbamazepine,200mg , filled 01/11/18, qty 60, 53 tabs left Aripiprzole, 10mg , filled 01/11/18, qty 30, 29 tabs left aripiprzole 10mg , filled 01/11/18, qty 30, 13 tabs left Gabapentin, 300mg , filled 01/11/18, qty 90, 88 tabs left Gabapentin, 300mg , filled 12/07/17, qty 90, 12 tabs left Hydroxizine, 25mg , filled 12/12/17, qty 120, 10 capsules left Carbamazepine, 200mg , filled 12/07/16 qty 60, 6 tabs left

## 2017-01-29 NOTE — ED Notes (Signed)
Called poison control, spoke with david. Recommend standard labs, ekg and 6 hour obs for medical clearance. Observe for sedation, bradycardia, hypotension, qt prolongation, qrs widening. May see some tachycardia with gabapentin dose.

## 2017-01-29 NOTE — ED Triage Notes (Signed)
PT STS HE TOOK THE PILLS TO KILL HIMSELF BECAUSE, "I AM TIRED OF BEING A DISAPPOINTMENT TO EVERYBODY ESPECIALLY HIS KIDS."

## 2017-01-29 NOTE — ED Provider Notes (Signed)
[facility]-EMERGENCY DEPT Provider Note   CSN: [id number] Arrival date & time: 01/29/17  2027     History   Chief Complaint Chief Complaint  Patient presents with  . Drug Overdose    HPI Silus Lanzo is a 41 y.o. male.  41 year old male here after intentionally overdose on multiple medications. Patient took an unknown combination of the following pills:Trazadone, 50mg  tab, filled 01/11/17, qty 30. 25 tabs left Carbamazepine,200mg , filled 01/11/18, qty 60, 53 tabs left Aripiprzole, 10mg , filled 01/11/18, qty 30, 29 tabs left aripiprzole 10mg , filled 01/11/18, qty 30, 13 tabs left Gabapentin, 300mg , filled 01/11/18, qty 90, 88 tabs left Gabapentin, 300mg , filled 12/07/17, qty 90, 12 tabs left Hydroxizine, 25mg , filled 12/12/17, qty 120, 10 capsules left Carbamazepine, 200mg , filled 12/07/16 qty 60, 6 tabs left  Patient states he did this because the voices and has had a total him to. He denies any use of Tylenol or aspirin. Denies being drowsy at this time. No headache noted. Denies any seizure activity. Call 911 and was transported here      Past Medical History:  Diagnosis Date  . Depression   . Difficulty controlling anger   . Hypertension   . PTSD (post-traumatic stress disorder)   . Suicidal behavior     Patient Active Problem List   Diagnosis Date Noted  . 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 Alcohol dependence with alcohol-induced mood disorder (HCC) 11/08/2016    History reviewed. No pertinent surgical history.     Home Medications    Prior to Admission medications   Medication Sig Start Date End Date Taking? Authorizing Provider  ARIPiprazole (ABILIFY) 10 MG tablet Take 1 tablet (10 mg total) by mouth at bedtime. 01/23/17   Charm Rings, NP  carbamazepine (TEGRETOL XR) 200 MG 12 hr tablet Take 1 tablet (200 mg total) by mouth 2 (two) times daily. 01/23/17   Charm Rings, NP  gabapentin (NEURONTIN) 300 MG  capsule Take 1 capsule (300 mg total) by mouth 3 (three) times daily. 01/23/17   Charm Rings, NP  guaiFENesin (ROBITUSSIN) 100 MG/5ML liquid Take 5-10 mLs (100-200 mg total) by mouth every 4 (four) hours as needed for cough. Patient not taking: Reported on 01/21/2017 01/21/17   [name], PA-C  hydrochlorothiazide (HYDRODIURIL) 25 MG tablet Take 1 tablet (25 mg total) by mouth daily. Patient not taking: Reported on 01/21/2017 01/21/17   [name], PA-C  hydrOXYzine (ATARAX/VISTARIL) 25 MG tablet Take 1 tablet (25 mg total) by mouth every 6 (six) hours as needed for anxiety (or CIWA score </= 10). 01/23/17   Charm Rings, NP  nicotine (NICODERM CQ - DOSED IN MG/24 HOURS) 21 mg/24hr patch Place 1 patch (21 mg total) onto the skin daily. Patient not taking: Reported on 01/21/2017 11/10/16   Laveda Abbe, NP  ondansetron (ZOFRAN) 4 MG tablet Take 1 tablet (4 mg total) by mouth every 6 (six) hours. Patient not taking: Reported on 01/21/2017 01/21/17   [name], PA-C  traZODone (DESYREL) 50 MG tablet Take 1 tablet (50 mg total) by mouth at bedtime. 01/23/17   Charm Rings, NP    Family History Family History  Problem Relation Age of Onset  . Diabetes Mother     Social History Social History  Substance Use Topics  . Smoking status: Former Games developer  . Smokeless tobacco: Never Used  . Alcohol use Yes     Comment: 6 pack daily  Allergies   Patient has no known allergies.   Review of Systems Review of Systems  All other systems reviewed and are negative.    Physical Exam Updated Vital Signs BP (!) 170/105 (BP Location: Left Arm)   Pulse 79   Temp 97.7 F (36.5 C) (Oral)   Resp 16   Ht 6' (1.829 m)   Wt 102.1 kg   SpO2 100%   BMI 30.52 kg/m   Physical Exam  Constitutional: He is oriented to person, place, and time. He appears well-developed and well-nourished.  Non-toxic appearance. No distress.  HENT:  Head: Normocephalic and atraumatic.  Eyes: Conjunctivae, EOM  and lids are normal. Pupils are equal, round, and reactive to light.  Neck: Normal range of motion. Neck supple. No tracheal deviation present. No thyroid mass present.  Cardiovascular: Normal rate, regular rhythm and normal heart sounds.  Exam reveals no gallop.   No murmur heard. Pulmonary/Chest: Effort normal and breath sounds normal. No stridor. No respiratory distress. He has no decreased breath sounds. He has no wheezes. He has no rhonchi. He has no rales.  Abdominal: Soft. Normal appearance and bowel sounds are normal. He exhibits no distension. There is no tenderness. There is no rebound and no CVA tenderness.  Musculoskeletal: Normal range of motion. He exhibits no edema or tenderness.  Neurological: He is alert and oriented to person, place, and time. He has normal strength. No cranial nerve deficit or sensory deficit. GCS eye subscore is 4. GCS verbal subscore is 5. GCS motor subscore is 6.  Skin: Skin is warm and dry. No abrasion and no rash noted.  Psychiatric: He has a normal mood and affect. His speech is normal. He is withdrawn. Thought content is paranoid and delusional. He expresses suicidal ideation. He expresses suicidal plans. He expresses no homicidal plans.  Nursing note and vitals reviewed.    ED Treatments / Results  Labs (all labs ordered are listed, but only abnormal results are displayed) Labs Reviewed  COMPREHENSIVE METABOLIC PANEL  ETHANOL  SALICYLATE LEVEL  ACETAMINOPHEN LEVEL  CBC  RAPID URINE DRUG SCREEN, HOSP PERFORMED  CBG MONITORING, ED    EKG  EKG Interpretation None       Radiology No results found.  Procedures Procedures (including critical care time)  Medications Ordered in ED Medications - No data to display   Initial Impression / Assessment and Plan / ED Course  I have reviewed the triage vital signs and the nursing notes.  Pertinent labs & imaging results that were available during my care of the patient were reviewed by me and  considered in my medical decision making (see chart for details).     Patient to be observed here and observe for toxicity. Spoke with poison control. They get the following recommendations.Recommend standard labs, ekg and 6 hour obs for medical clearance. Observe for sedation, bradycardia, hypotension, qt prolongation, qrs widening. May see some tachycardia with gabapentin dose. Will check Tegretol level. Will consult TTS once patient medically cleared  Final Clinical Impressions(s) / ED Diagnoses   Final diagnoses:  None    New Prescriptions New Prescriptions   No medications on file     Lorre NickAnthony Williemae Muriel, MD 01/29/17 2130

## 2017-01-29 NOTE — ED Notes (Signed)
Bed: WA04 Expected date:  Expected time:  Means of arrival:  Comments: 40 overrdose

## 2017-01-29 NOTE — ED Triage Notes (Signed)
Pt arrives via EMS, stated he is SI, took an unknown amount of gabapentin, Abilify, and hydroxyzine about 3745min-1 hour ago. bp palpated 218 for ems. EKG WNL.

## 2017-01-30 LAB — CARBAMAZEPINE LEVEL, TOTAL: Carbamazepine Lvl: <2 ug/mL — ABNORMAL LOW (ref 4.0–12.0)
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
## 2017-01-30 NOTE — ED Notes (Signed)
Spoke with Tom, TTS. Elijah Birkom states he will call me when patient can be discharged.

## 2017-01-30 NOTE — Discharge Instructions (Signed)
For your ongoing mental health needs, you are advised to follow up with [facility].  New and returning patients are seen at their walk-in clinic.  Walk-in hours are Monday - Friday from 8:00 am - 3:00 pm.  Walk-in patients are seen on a first come, first served basis.  Try to arrive as early as possible for he best chance of being seen the same day: ° °     [facility] °     [address] °     [place], [place] [address] °     [phone] °

## 2017-01-30 NOTE — ED Notes (Signed)
Discharge instructions and follow up care reviewed with patient. Patient verbalized understanding. 

## 2017-01-30 NOTE — ED Notes (Signed)
Per Onalee Huaavid with [facility]. Patient is cleared to go home or to what ever disposition the provider request.

## 2017-01-30 NOTE — ED Notes (Signed)
TTS recommends inpatient services.

## 2017-01-30 NOTE — BHH Suicide Risk Assessment (Signed)
Suicide Risk Assessment  Discharge Assessment   [facility]   Principal Problem: Alcohol dependence with alcohol-induced mood disorder [facility](HCC) Discharge Diagnoses:  Patient Active Problem List   Diagnosis Date Noted  . Alcohol dependence with alcohol-induced mood disorder (HCC) [F10.24] 11/08/2016    Priority: High    Total Time spent with patient: 45 minutes  Musculoskeletal: Strength & Muscle Tone: within normal limits Gait & Station: normal Patient leans: N/A  Psychiatric Specialty Exam:   Blood pressure 150/70, pulse 65, temperature 98.3 F (36.8 C), temperature source Oral, resp. rate 18, height 6' (1.829 m), weight 102.1 kg (225 lb), SpO2 98 %.Body mass index is 30.52 kg/m.  General Appearance: Casual  Eye Contact::  Good  Speech:  Normal Rate409  Volume:  Normal  Mood:  Depressed, mild  Affect:  Congruent  Thought Process:  Coherent and Descriptions of Associations: Intact  Orientation:  Full (Time, Place, and Person)  Thought Content:  WDL  Suicidal Thoughts:  No  Homicidal Thoughts:  No  Memory:  Immediate;   Good Recent;   Good Remote;   Good  Judgement:  Fair  Insight:  Fair  Psychomotor Activity:  Normal  Concentration:  Good  Recall:  Good  Fund of Knowledge:Fair  Language: Good  Akathisia:  No  Handed:  Right  AIMS (if indicated):     Assets:  Leisure Time Physical Health Resilience  Sleep:     Cognition: WNL  ADL's:  Intact   Mental Status Per Nursing Assessment::   On Admission:   alcohol intoxication with alleged overdose when he was intoxication, acetaminophen level WDL.  He is well known to this ED and providers for similar presentations.  His biggest issue being homeless.  Demographic Factors:  Male  Loss Factors: NA  Historical Factors: NA  Risk Reduction Factors:   Sense of responsibility to family  Continued Clinical Symptoms:  Alcohol abuse, depressed mild  Cognitive Features That Contribute To  Risk:  None    Suicide Risk:  Minimal: No identifiable suicidal ideation.  Patients presenting with no risk factors but with morbid ruminations; may be classified as minimal risk based on the severity of the depressive symptoms    Plan Of Care/Follow-up recommendations:  Activity:  as tolerated Diet:  heart healthy diet  Reinhardt Licausi, NP 01/30/2017, 9:05 AM

## 2017-01-30 NOTE — ED Notes (Signed)
Bed: [facility] Expected date:  Expected time:  Means of arrival:  Comments: 

## 2017-01-30 NOTE — BH Assessment (Signed)
[facility] Assessment Progress Note  Per Mojeed Akintayo, MD, this pt does not require psychiatric hospitalization at this time.  Pt is to be discharged from [facility] with recommendation to follow up with [facility].  This has been included in pt's discharge instructions.  Pt's nurse has been notified.  Alberto Pina, MA Triage Specialist [phone]     

## 2017-01-31 ENCOUNTER — Encounter (HOSPITAL_COMMUNITY): Payer: Self-pay | Admitting: *Deleted

## 2017-01-31 DIAGNOSIS — R51 Headache: Secondary | ICD-10-CM | POA: Insufficient documentation

## 2017-01-31 DIAGNOSIS — Z87891 Personal history of nicotine dependence: Secondary | ICD-10-CM | POA: Insufficient documentation

## 2017-01-31 DIAGNOSIS — I10 Essential (primary) hypertension: Secondary | ICD-10-CM | POA: Insufficient documentation

## 2017-01-31 DIAGNOSIS — R079 Chest pain, unspecified: Secondary | ICD-10-CM | POA: Insufficient documentation

## 2017-01-31 DIAGNOSIS — Z79899 Other long term (current) drug therapy: Secondary | ICD-10-CM | POA: Insufficient documentation

## 2017-01-31 NOTE — ED Triage Notes (Signed)
Pt to ED from home c/o chest pain since yesterday with SOB and headache x 2 days.

## 2017-02-01 ENCOUNTER — Emergency Department (HOSPITAL_COMMUNITY): Payer: Self-pay

## 2017-02-01 ENCOUNTER — Emergency Department (HOSPITAL_COMMUNITY)
Admission: EM | Admit: 2017-02-01 | Discharge: 2017-02-01 | Disposition: A | Discharge status: Home / Self Care | Payer: Self-pay | Attending: Emergency Medicine | Admitting: Emergency Medicine

## 2017-02-01 DIAGNOSIS — R519 Headache, unspecified: Secondary | ICD-10-CM

## 2017-02-01 DIAGNOSIS — I10 Essential (primary) hypertension: Secondary | ICD-10-CM

## 2017-02-01 DIAGNOSIS — R079 Chest pain, unspecified: Secondary | ICD-10-CM

## 2017-02-01 DIAGNOSIS — R51 Headache: Secondary | ICD-10-CM

## 2017-02-01 LAB — BASIC METABOLIC PANEL
ANION GAP: 12 (ref 5–15)
BUN: 13 mg/dL (ref 6–20)
CALCIUM: 8.8 mg/dL — AB (ref 8.9–10.3)
CHLORIDE: 102 mmol/L (ref 101–111)
CO2: 27 mmol/L (ref 22–32)
Creatinine, Ser: 1.11 mg/dL (ref 0.61–1.24)
GFR calc non Af Amer: >60 mL/min (ref >60)
GLUCOSE: 103 mg/dL — AB (ref 65–99)
POTASSIUM: 3.1 mmol/L — AB (ref 3.5–5.1)
Sodium: 141 mmol/L (ref 135–145)

## 2017-02-01 LAB — HEPATIC FUNCTION PANEL
ALT: 20 U/L (ref 17–63)
AST: 28 U/L (ref 15–41)
Albumin: 4 g/dL (ref 3.5–5.0)
Alkaline Phosphatase: 101 U/L (ref 38–126)
BILIRUBIN DIRECT: 0.1 mg/dL (ref 0.1–0.5)
BILIRUBIN INDIRECT: 0.3 mg/dL (ref 0.3–0.9)
Total Bilirubin: 0.4 mg/dL (ref 0.3–1.2)
Total Protein: 7.5 g/dL (ref 6.5–8.1)

## 2017-02-01 LAB — I-STAT TROPONIN, ED: Troponin i, poc: 0 ng/mL (ref 0.00–0.08)

## 2017-02-01 LAB — CBC
HEMATOCRIT: 39.6 % (ref 39.0–52.0)
HEMOGLOBIN: 13.6 g/dL (ref 13.0–17.0)
MCH: 31.1 pg (ref 26.0–34.0)
MCHC: 34.3 g/dL (ref 30.0–36.0)
MCV: 90.4 fL (ref 78.0–100.0)
Platelets: 218 10*3/uL (ref 150–400)
RBC: 4.38 MIL/uL (ref 4.22–5.81)
RDW: 12.5 % (ref 11.5–15.5)
WBC: 5.1 10*3/uL (ref 4.0–10.5)

## 2017-02-01 LAB — LIPASE, BLOOD: LIPASE: 22 U/L (ref 11–51)

## 2017-02-01 LAB — ETHANOL: Alcohol, Ethyl (B): 138 mg/dL — ABNORMAL HIGH (ref <5)

## 2017-02-01 MED ORDER — PROCHLORPERAZINE EDISYLATE 5 MG/ML IJ SOLN
10.0000 mg | Freq: Once | INTRAMUSCULAR | Status: AC
Start: 2017-02-01 — End: 2017-02-01
  Administered 2017-02-01: 10 mg via INTRAVENOUS
  Filled 2017-02-01: qty 2

## 2017-02-01 MED ORDER — HYDROCHLOROTHIAZIDE 25 MG PO TABS
25.0000 mg | ORAL_TABLET | Freq: Every day | ORAL | Status: DC
  Administered 2017-02-01: 25 mg via ORAL
  Filled 2017-02-01: qty 1

## 2017-02-01 MED ORDER — HYDROCHLOROTHIAZIDE 25 MG PO TABS: 25.0000 mg | ORAL_TABLET | Freq: Every day | ORAL | 0 refills | Status: AC

## 2017-02-01 MED ORDER — DIPHENHYDRAMINE HCL 25 MG PO CAPS
25.0000 mg | ORAL_CAPSULE | Freq: Once | ORAL | Status: AC
  Administered 2017-02-01: 25 mg via ORAL
  Filled 2017-02-01: qty 1

## 2017-02-01 MED ORDER — POTASSIUM CHLORIDE CRYS ER 20 MEQ PO TBCR
40.0000 meq | EXTENDED_RELEASE_TABLET | Freq: Once | ORAL | Status: AC
  Administered 2017-02-01: 40 meq via ORAL
  Filled 2017-02-01: qty 2

## 2017-02-01 MED ORDER — POTASSIUM CHLORIDE CRYS ER 20 MEQ PO TBCR: 20.0000 meq | EXTENDED_RELEASE_TABLET | Freq: Every day | ORAL | 0 refills | Status: DC

## 2017-02-01 NOTE — ED Provider Notes (Signed)
[facility]-EMERGENCY DEPT Provider Note   CSN: [id number] Arrival date & time: 01/31/17  2344     History   Chief Complaint Chief Complaint  Patient presents with  . Chest Pain  . Headache    HPI Scott Fischer is a 41 y.o. male with a hx of HTN presents to the Emergency Department complaining of gradual, persistent, progressively worsening generalized headache onset 2 days ago.  Pt reports intermittent headaches for the last 3-4 months similar to today.  Denies thunderclap or worst headache of his life.  No treatments PTA.  Pt reports he is to be taking BP medications, but has not been compliant due to financial issues.  Pt reports he has had liquor and beer today and drinks every day.  Pt is a smoker. Associated symptoms include constant, unchanged central chest pain that began around 7am.  He reports the pain is tight rated at a 9/10.  Pt denies N/V, diaphoresis, syncope. No aggravating or alleviating factors.  Pt denies DOE, SOB.  Denies hx of cardiac hx and no family hx of early cardiac disease.     The history is provided by medical records and the patient. No language interpreter was used.  Headache      Past Medical History:  Diagnosis Date  . Depression   . Difficulty controlling anger   . Hypertension   . PTSD (post-traumatic stress disorder)   . Suicidal behavior     Patient Active Problem List   Diagnosis Date Noted  . Alcohol dependence with alcohol-induced mood disorder (HCC) 11/08/2016    History reviewed. No pertinent surgical history.     Home Medications    Prior to Admission medications   Medication Sig Start Date End Date Taking? Authorizing Provider  ARIPiprazole (ABILIFY) 10 MG tablet Take 1 tablet (10 mg total) by mouth at bedtime. 01/23/17   Charm Rings, NP  carbamazepine (TEGRETOL XR) 200 MG 12 hr tablet Take 1 tablet (200 mg total) by mouth 2 (two) times daily. 01/23/17   Charm Rings, NP  gabapentin (NEURONTIN) 300 MG capsule Take 1  capsule (300 mg total) by mouth 3 (three) times daily. 01/23/17   Charm Rings, NP  guaiFENesin (ROBITUSSIN) 100 MG/5ML liquid Take 5-10 mLs (100-200 mg total) by mouth every 4 (four) hours as needed for cough. Patient not taking: Reported on 01/21/2017 01/21/17   [name], PA-C  hydrochlorothiazide (HYDRODIURIL) 25 MG tablet Take 1 tablet (25 mg total) by mouth daily. 02/01/17   Tineka Uriegas, PA-C  hydrOXYzine (ATARAX/VISTARIL) 25 MG tablet Take 1 tablet (25 mg total) by mouth every 6 (six) hours as needed for anxiety (or CIWA score </= 10). 01/23/17   Charm Rings, NP  nicotine (NICODERM CQ - DOSED IN MG/24 HOURS) 21 mg/24hr patch Place 1 patch (21 mg total) onto the skin daily. Patient not taking: Reported on 01/21/2017 11/10/16   Laveda Abbe, NP  ondansetron (ZOFRAN) 4 MG tablet Take 1 tablet (4 mg total) by mouth every 6 (six) hours. Patient not taking: Reported on 01/21/2017 01/21/17   [name], PA-C  potassium chloride SA (K-DUR,KLOR-CON) 20 MEQ tablet Take 1 tablet (20 mEq total) by mouth daily. 02/01/17   Nadine Ryle, PA-C  traZODone (DESYREL) 50 MG tablet Take 1 tablet (50 mg total) by mouth at bedtime. 01/23/17   Charm Rings, NP    Family History Family History  Problem Relation Age of Onset  . Diabetes Mother     Social  History Social History  Substance Use Topics  . Smoking status: Former Games developermoker  . Smokeless tobacco: Never Used  . Alcohol use Yes     Comment: 6 pack daily     Allergies   Patient has no known allergies.   Review of Systems Review of Systems  Cardiovascular: Positive for chest pain.  Neurological: Positive for headaches.  All other systems reviewed and are negative.    Physical Exam Updated Vital Signs BP (!) 179/106   Pulse 105   Temp 97.9 F (36.6 C)   Resp 18   SpO2 100%   Physical Exam  Constitutional: He appears well-developed and well-nourished. No distress.  Awake, alert, nontoxic appearance  HENT:  Head:  Normocephalic and atraumatic.  Mouth/Throat: Oropharynx is clear and moist. No oropharyngeal exudate.  Eyes: Conjunctivae are normal. No scleral icterus.  Neck: Normal range of motion. Neck supple.  Cardiovascular: Normal rate, regular rhythm and intact distal pulses.   Pulmonary/Chest: Effort normal and breath sounds normal. No respiratory distress. He has no wheezes.  Equal chest expansion  Abdominal: Soft. Bowel sounds are normal. He exhibits no mass. There is no tenderness. There is no rebound and no guarding.  Musculoskeletal: Normal range of motion. He exhibits no edema.  Neurological: He is alert.  Speech is clear and goal oriented Moves extremities without ataxia  Skin: Skin is warm and dry. He is not diaphoretic.  Psychiatric: He has a normal mood and affect.  Nursing note and vitals reviewed.    ED Treatments / Results  Labs (all labs ordered are listed, but only abnormal results are displayed) Labs Reviewed  BASIC METABOLIC PANEL - Abnormal; Notable for the following:       Result Value   Potassium 3.1 (*)    Glucose, Bld 103 (*)    Calcium 8.8 (*)    All other components within normal limits  ETHANOL - Abnormal; Notable for the following:    Alcohol, Ethyl (B) 138 (*)    All other components within normal limits  CBC  LIPASE, BLOOD  HEPATIC FUNCTION PANEL  I-STAT TROPOININ, ED    EKG  EKG Interpretation  Date/Time:  Tuesday January 31 2017 23:56:53 EST Ventricular Rate:  95 PR Interval:  190 QRS Duration: 88 QT Interval:  358 QTC Calculation: 449 R Axis:   72 Text Interpretation:  Normal sinus rhythm T wave abnormality, consider inferior ischemia Abnormal ECG Confirmed by Wilkie AyeHORTON  MD, COURTNEY ([id number]) on 02/01/2017 5:26:32 AM       Radiology Dg Chest 2 View  Result Date: 02/01/2017 CLINICAL DATA:  Centralized chest pain and shortness of breath EXAM: CHEST  2 VIEW COMPARISON:  01/21/2017 FINDINGS: The heart size and mediastinal contours are within normal  limits. Both lungs are clear. The visualized skeletal structures are unremarkable. IMPRESSION: No active cardiopulmonary disease. Electronically Signed   By: Jasmine PangKim  Fujinaga M.D.   On: 02/01/2017 00:37   Ct Head Wo Contrast  Result Date: 02/01/2017 CLINICAL DATA:  Initial evaluation for acute headache for 1 month, with new onset numbness and tingling in extremities today. EXAM: CT HEAD WITHOUT CONTRAST TECHNIQUE: Contiguous axial images were obtained from the base of the skull through the vertex without intravenous contrast. COMPARISON:  None available. FINDINGS: Brain: Cerebral volume within normal limits for patient age. No evidence for acute intracranial hemorrhage. No findings to suggest acute large vessel territory infarct. No mass lesion, midline shift, or mass effect. Ventricles are normal in size without evidence for hydrocephalus. No  extra-axial fluid collection identified. Vascular: No hyperdense vessel identified. Skull: Scalp soft tissues demonstrate no acute abnormality.Calvarium intact. Sinuses/Orbits: Globes and orbital soft tissues are within normal limits. Retention cyst with mucosal thickening within the left maxillary sinus small retention cyst within the lateral recess of the right sphenoid sinus is well. Paranasal sinuses are otherwise clear. No mastoid effusion. IMPRESSION: Negative head CT.  No acute intracranial process identified. Electronically Signed   By: Rise Mu M.D.   On: 02/01/2017 02:19    Procedures Procedures (including critical care time)  Medications Ordered in ED Medications  hydrochlorothiazide (HYDRODIURIL) tablet 25 mg (25 mg Oral Given 02/01/17 0204)  prochlorperazine (COMPAZINE) injection 10 mg (10 mg Intravenous Given 02/01/17 0210)  diphenhydrAMINE (BENADRYL) capsule 25 mg (25 mg Oral Given 02/01/17 0204)  potassium chloride SA (K-DUR,KLOR-CON) CR tablet 40 mEq (40 mEq Oral Given 02/01/17 0332)     Initial Impression / Assessment and Plan / ED Course    I have reviewed the triage vital signs and the nursing notes.  Pertinent labs & imaging results that were available during my care of the patient were reviewed by me and considered in my medical decision making (see chart for details).  Clinical Course as of Feb 02 516  Wed Feb 01, 2017  0259 Patient reports that his headache has improved. His blood pressure is improving. His chest pain has resolved. His tachycardia has resolved.  [HM]  [id number] Patient appears well. Labs are reassuring. Chest pain is completely resolved. Headache is completely resolved. His blood pressure has improved.  [HM]    Clinical Course User Index [HM] Dahlia Client Peyten Punches, PA-C    Pt presents with multiple complaints.  Pt HA treated and improved while in ED.  Presentation is like pts typical HA and non concerning for [facility], ICH, Meningitis, or temporal arteritis. Pt is afebrile with no focal neuro deficits, nuchal rigidity, or change in vision.   Pt is PERC negative, VSS, no tracheal deviation, no JVD or new murmur, RRR, breath sounds equal bilaterally, EKG without acute abnormalities, negative troponin, and negative CXR. Patient administered his home blood pressure medications with significant improvement. Patient is chest pain-free in the emergency department. Pt has been advised to return to the ED if CP becomes exertional, associated with diaphoresis or nausea, radiates to left jaw/arm, worsens or becomes concerning in any way.   Patient labs are reassuring. Hypokalemia noted. Repletion begun in the department and will be discharged home with prescription. Patient discharged home with hypertension prescription. Chest his financial constraints and have recommended that he still be that [employer] on the four-hour lip. Patient states understanding and reports he will attempt to do this today.  Final Clinical Impressions(s) / ED Diagnoses   Final diagnoses:  Hypertension, unspecified type  Nonspecific chest pain  Bad  headache    New Prescriptions New Prescriptions   POTASSIUM CHLORIDE SA (K-DUR,KLOR-CON) 20 MEQ TABLET    Take 1 tablet (20 mEq total) by mouth daily.     Dahlia Client Anitta Tenny, PA-C 02/01/17 4098    Shon Baton, MD 02/01/17 [phone]

## 2017-02-01 NOTE — ED Notes (Addendum)
See providers notes

## 2017-02-01 NOTE — Discharge Instructions (Signed)
1. Medications: HCTZ, usual home medications 2. Treatment: rest, drink plenty of fluids,  3. Follow Up: Please followup with your primary doctor in 3-5 days for discussion of your diagnoses and further evaluation after today's visit; if you do not have a primary care doctor use the resource guide provided to find one; Please return to the ER for return of headache, chest pain or other concerns
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
## 2018-06-09 ENCOUNTER — Emergency Department (HOSPITAL_COMMUNITY)
Admission: EM | Admit: 2018-06-09 | Discharge: 2018-06-09 | Disposition: A | Discharge status: Home / Self Care | Payer: Non-veteran care | Attending: Emergency Medicine | Admitting: Emergency Medicine

## 2018-06-09 ENCOUNTER — Emergency Department (HOSPITAL_COMMUNITY): Payer: Non-veteran care

## 2018-06-09 ENCOUNTER — Encounter (HOSPITAL_COMMUNITY): Payer: Self-pay | Admitting: Emergency Medicine

## 2018-06-09 DIAGNOSIS — I10 Essential (primary) hypertension: Secondary | ICD-10-CM | POA: Insufficient documentation

## 2018-06-09 DIAGNOSIS — Y939 Activity, unspecified: Secondary | ICD-10-CM | POA: Diagnosis not present

## 2018-06-09 DIAGNOSIS — S022XXA Fracture of nasal bones, initial encounter for closed fracture: Secondary | ICD-10-CM | POA: Insufficient documentation

## 2018-06-09 DIAGNOSIS — Y929 Unspecified place or not applicable: Secondary | ICD-10-CM | POA: Diagnosis not present

## 2018-06-09 DIAGNOSIS — S0992XA Unspecified injury of nose, initial encounter: Secondary | ICD-10-CM | POA: Diagnosis present

## 2018-06-09 DIAGNOSIS — Z87891 Personal history of nicotine dependence: Secondary | ICD-10-CM | POA: Diagnosis not present

## 2018-06-09 DIAGNOSIS — Y999 Unspecified external cause status: Secondary | ICD-10-CM | POA: Diagnosis not present

## 2018-06-09 DIAGNOSIS — Z79899 Other long term (current) drug therapy: Secondary | ICD-10-CM | POA: Diagnosis not present

## 2018-06-09 MED ORDER — ACETAMINOPHEN 325 MG PO TABS
650.0000 mg | ORAL_TABLET | Freq: Once | ORAL | Status: AC
  Administered 2018-06-09: 650 mg via ORAL
  Filled 2018-06-09: qty 2

## 2018-06-09 MED ORDER — AMOXICILLIN-POT CLAVULANATE 875-125 MG PO TABS: 1.0000 | ORAL_TABLET | Freq: Two times a day (BID) | ORAL | 0 refills | Status: AC

## 2018-06-09 MED ORDER — HYDROCODONE-ACETAMINOPHEN 5-325 MG PO TABS: 1.0000 | ORAL_TABLET | ORAL | 0 refills | Status: AC | PRN

## 2018-06-09 NOTE — Discharge Instructions (Addendum)
Apply cold compresses to face for 20 minutes at a time. Augmentin as prescribed, complete the full course. Take Norco as needed as prescribed for pain, do not drive while taking Norco. Motrin as needed as directed for pain. Follow up with ENT for recheck and further management, referral given. Monitor your blood pressure- follow up with your PCP if your blood pressure remains elevated, return to the ER for worsening or concerning symptoms.

## 2018-06-09 NOTE — ED Notes (Signed)
Patient transported to X-ray 
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
## 2019-03-09 ENCOUNTER — Encounter (HOSPITAL_COMMUNITY): Payer: Self-pay | Admitting: Family Medicine

## 2019-03-09 ENCOUNTER — Ambulatory Visit (HOSPITAL_COMMUNITY)
Admission: EM | Admit: 2019-03-09 | Discharge: 2019-03-09 | Disposition: A | Discharge status: Home / Self Care | Payer: No Typology Code available for payment source | Attending: Family Medicine | Admitting: Family Medicine

## 2019-03-09 ENCOUNTER — Other Ambulatory Visit: Payer: Self-pay

## 2019-03-09 DIAGNOSIS — L02214 Cutaneous abscess of groin: Secondary | ICD-10-CM

## 2019-03-09 MED ORDER — DOXYCYCLINE HYCLATE 100 MG PO TABS: 100.0000 mg | ORAL_TABLET | Freq: Two times a day (BID) | ORAL | 0 refills | Status: DC

## 2019-03-09 NOTE — ED Triage Notes (Signed)
Per pt, c/o "boil" to right groin area x 3 days with progressive worsening.  Denies fevers.

## 2019-03-09 NOTE — Discharge Instructions (Addendum)
Wash the abscess area gently with soap and warm water three times a day for the next 5 days  Return if swelling comes back

## 2019-03-09 NOTE — ED Provider Notes (Signed)
[facility]    CSN: [id number] Arrival date & time: 03/09/19  1649     History   Chief Complaint Chief Complaint  Patient presents with  . Abscess    HPI Scott Fischer is a 43 y.o. male.   43 yo here for his initial [facility] visit.  He presents today for evaluation of abscess.  Onset 3 days ago with right groin swelling, no fever.  No prior abscess hx.     Past Medical History:  Diagnosis Date  . Depression   . Difficulty controlling anger   . Hypertension   . PTSD (post-traumatic stress disorder)   . Suicidal behavior     Patient Active Problem List   Diagnosis Date Noted  . Alcohol dependence with alcohol-induced mood disorder (HCC) 11/08/2016    Past Surgical History:  Procedure Laterality Date  . EYE SURGERY         Home Medications    Prior to Admission medications   Medication Sig Start Date End Date Taking? Authorizing Provider  ARIPiprazole (ABILIFY) 10 MG tablet Take 1 tablet (10 mg total) by mouth at bedtime. 01/23/17   Charm Rings, NP  carbamazepine (TEGRETOL XR) 200 MG 12 hr tablet Take 1 tablet (200 mg total) by mouth 2 (two) times daily. 01/23/17   Charm Rings, NP  doxycycline (VIBRA-TABS) 100 MG tablet Take 1 tablet (100 mg total) by mouth 2 (two) times daily. 03/09/19   Elvina Sidle, MD  gabapentin (NEURONTIN) 300 MG capsule Take 1 capsule (300 mg total) by mouth 3 (three) times daily. 01/23/17   Charm Rings, NP  hydrochlorothiazide (HYDRODIURIL) 25 MG tablet Take 1 tablet (25 mg total) by mouth daily. 02/01/17   Muthersbaugh, Dahlia Client, PA-C  hydrOXYzine (ATARAX/VISTARIL) 25 MG tablet Take 1 tablet (25 mg total) by mouth every 6 (six) hours as needed for anxiety (or CIWA score </= 10). 01/23/17   Charm Rings, NP  potassium chloride SA (K-DUR,KLOR-CON) 20 MEQ tablet Take 1 tablet (20 mEq total) by mouth daily. 02/01/17   Muthersbaugh, Dahlia Client, PA-C  traZODone (DESYREL) 50 MG tablet Take 1 tablet (50 mg total) by mouth at  bedtime. 01/23/17   Charm Rings, NP    Family History Family History  Problem Relation Age of Onset  . Diabetes Mother     Social History Social History   Tobacco Use  . Smoking status: Former Games developer  . Smokeless tobacco: Never Used  Substance Use Topics  . Alcohol use: Yes    Comment: 6 pack daily  . Drug use: Yes    Types: Marijuana    Comment:       Allergies   Patient has no known allergies.   Review of Systems Review of Systems  All other systems reviewed and are negative.    Physical Exam Triage Vital Signs ED Triage Vitals  Enc Vitals Group     BP      Pulse      Resp      Temp      Temp src      SpO2      Weight      Height      Head Circumference      Peak Flow      Pain Score      Pain Loc      Pain Edu?      Excl. in GC?    No data found.  Updated Vital Signs BP Marland Kitchen)  154/101   Pulse (!) 102   Temp 99.1 F (37.3 C) (Oral)   Resp 16   SpO2 99%    Physical Exam Vitals signs and nursing note reviewed.  Constitutional:      Appearance: Normal appearance.  HENT:     Head: Normocephalic and atraumatic.  Abdominal:     Comments: 3 cm fluctuant mass right inguinal area.  Tender, red.  Skin:    General: Skin is warm and dry.  Neurological:     Mental Status: He is alert.      UC Treatments / Results  Labs (all labs ordered are listed, but only abnormal results are displayed) Labs Reviewed - No data to display  EKG None  Radiology No results found.  Procedures Incision and Drainage Date/Time: 03/09/2019 5:31 PM Performed by: Elvina SidleLauenstein, Hikari Tripp, MD Authorized by: Elvina SidleLauenstein, Milisa Kimbell, MD   Consent:    Consent obtained:  Verbal   Consent given by:  Patient   Risks discussed:  Incomplete drainage and infection   Alternatives discussed:  No treatment Location:    Type:  Abscess   Location:  Trunk   Trunk location:  Abdomen Pre-procedure details:    Skin preparation:  Antiseptic wash Anesthesia (see MAR for exact  dosages):    Anesthesia method:  Local infiltration   Local anesthetic:  Lidocaine 2% WITH epi Procedure type:    Complexity:  Complex Procedure details:    Needle aspiration: no     Incision types:  Stab incision   Incision depth:  Submucosal   Scalpel blade:  11   Wound management:  Extensive cleaning   Drainage:  Purulent   Drainage amount:  Copious   Packing materials:  None Post-procedure details:    Patient tolerance of procedure:  Tolerated well, no immediate complications   (including critical care time)  Medications Ordered in UC Medications - No data to display  Initial Impression / Assessment and Plan / UC Course  I have reviewed the triage vital signs and the nursing notes.  Pertinent labs & imaging results that were available during my care of the patient were reviewed by me and considered in my medical decision making (see chart for details).    Final Clinical Impressions(s) / UC Diagnoses   Final diagnoses:  Abscess of right groin     Discharge Instructions     Wash the abscess area gently with soap and warm water three times a day for the next 5 days  Return if swelling comes back    ED Prescriptions    Medication Sig Dispense Auth. Provider   doxycycline (VIBRA-TABS) 100 MG tablet Take 1 tablet (100 mg total) by mouth 2 (two) times daily. 20 tablet Elvina SidleLauenstein, Rickesha Veracruz, MD     Controlled Substance Prescriptions [place] Controlled Substance Registry consulted? Not Applicable   Elvina SidleLauenstein, Rielle Schlauch, MD 03/09/19 1733

## 2019-08-09 ENCOUNTER — Other Ambulatory Visit: Payer: Self-pay

## 2019-08-09 ENCOUNTER — Encounter (HOSPITAL_COMMUNITY): Payer: Self-pay

## 2019-08-09 ENCOUNTER — Ambulatory Visit (HOSPITAL_COMMUNITY)
Admission: EM | Admit: 2019-08-09 | Discharge: 2019-08-09 | Disposition: A | Discharge status: Home / Self Care | Payer: No Typology Code available for payment source | Attending: Urgent Care | Admitting: Urgent Care

## 2019-08-09 DIAGNOSIS — B9789 Other viral agents as the cause of diseases classified elsewhere: Secondary | ICD-10-CM | POA: Diagnosis not present

## 2019-08-09 DIAGNOSIS — Z79899 Other long term (current) drug therapy: Secondary | ICD-10-CM | POA: Insufficient documentation

## 2019-08-09 DIAGNOSIS — R05 Cough: Secondary | ICD-10-CM

## 2019-08-09 DIAGNOSIS — J029 Acute pharyngitis, unspecified: Secondary | ICD-10-CM

## 2019-08-09 DIAGNOSIS — R059 Cough, unspecified: Secondary | ICD-10-CM

## 2019-08-09 DIAGNOSIS — Z20828 Contact with and (suspected) exposure to other viral communicable diseases: Secondary | ICD-10-CM | POA: Insufficient documentation

## 2019-08-09 DIAGNOSIS — F329 Major depressive disorder, single episode, unspecified: Secondary | ICD-10-CM | POA: Insufficient documentation

## 2019-08-09 DIAGNOSIS — R0981 Nasal congestion: Secondary | ICD-10-CM

## 2019-08-09 DIAGNOSIS — J069 Acute upper respiratory infection, unspecified: Secondary | ICD-10-CM

## 2019-08-09 DIAGNOSIS — R070 Pain in throat: Secondary | ICD-10-CM | POA: Insufficient documentation

## 2019-08-09 DIAGNOSIS — I10 Essential (primary) hypertension: Secondary | ICD-10-CM

## 2019-08-09 DIAGNOSIS — F4310 Post-traumatic stress disorder, unspecified: Secondary | ICD-10-CM | POA: Insufficient documentation

## 2019-08-09 LAB — POCT RAPID STREP A: Streptococcus, Group A Screen (Direct): NEGATIVE

## 2019-08-09 MED ORDER — ONDANSETRON 8 MG PO TBDP: 8.0000 mg | ORAL_TABLET | Freq: Three times a day (TID) | ORAL | 0 refills | Status: DC | PRN

## 2019-08-09 MED ORDER — BENZONATATE 100 MG PO CAPS: 100.0000 mg | ORAL_CAPSULE | Freq: Three times a day (TID) | ORAL | 0 refills | Status: DC | PRN

## 2019-08-09 MED ORDER — NAPROXEN 500 MG PO TABS: 500.0000 mg | ORAL_TABLET | Freq: Two times a day (BID) | ORAL | 0 refills | Status: DC

## 2019-08-09 MED ORDER — PROMETHAZINE-DM 6.25-15 MG/5ML PO SYRP: 5.0000 mL | ORAL_SOLUTION | Freq: Three times a day (TID) | ORAL | 0 refills | Status: DC | PRN

## 2019-08-09 NOTE — Discharge Instructions (Signed)

## 2019-08-09 NOTE — ED Triage Notes (Signed)
Patient presents to Urgent Care with complaints of nasal congestion  since 2 days ago. Patient reports yesterday he developed a cough and slight sore throat. Several episodes of vomiting this morning. Pt took motion sickness pills and some kind of liquid medicine for nausea.

## 2019-08-09 NOTE — ED Provider Notes (Signed)
MRN: [id number] DOB: 02-Aug-1976  Subjective:   Scott Fischer is a 43 y.o. male presenting for 2-day history of acute onset moderate postnasal drainage, postnasal drip with mild sore throat, scratchy throat and worsening cough.  Patient has tried over-the-counter medications with minimal relief.  States that his cough has not given him chest pain and keeping him up at night.  Denies any known COVID-19 contacts.  Patient is on disability, does not work.  Smokes marijuana occasionally. Denies smoking cigarettes. Denies hx of asthma.    No current facility-administered medications for this encounter.   Current Outpatient Medications:  .  carbamazepine (TEGRETOL XR) 200 MG 12 hr tablet, Take 1 tablet (200 mg total) by mouth 2 (two) times daily., Disp: 60 tablet, Rfl: 0 .  gabapentin (NEURONTIN) 300 MG capsule, Take 1 capsule (300 mg total) by mouth 3 (three) times daily., Disp: 90 capsule, Rfl: 0 .  hydrochlorothiazide (HYDRODIURIL) 25 MG tablet, Take 1 tablet (25 mg total) by mouth daily., Disp: 30 tablet, Rfl: 0 .  potassium chloride SA (K-DUR,KLOR-CON) 20 MEQ tablet, Take 1 tablet (20 mEq total) by mouth daily., Disp: 7 tablet, Rfl: 0    No Known Allergies   Past Medical History:  Diagnosis Date  . Depression   . Difficulty controlling anger   . Hypertension   . PTSD (post-traumatic stress disorder)   . Suicidal behavior      Past Surgical History:  Procedure Laterality Date  . EYE SURGERY      Review of Systems  Constitutional: Negative for fever and malaise/fatigue.  HENT: Positive for congestion and sore throat. Negative for ear pain and sinus pain.   Eyes: Negative for blurred vision, double vision, discharge and redness.  Respiratory: Positive for cough. Negative for hemoptysis, shortness of breath and wheezing.   Cardiovascular: Positive for chest pain.  Gastrointestinal: Negative for abdominal pain, diarrhea, nausea and vomiting.  Genitourinary: Negative for  dysuria, flank pain and hematuria.  Musculoskeletal: Negative for myalgias.  Skin: Negative for rash.  Neurological: Positive for headaches. Negative for dizziness and weakness.  Psychiatric/Behavioral: Negative for depression and substance abuse.    Objective:   Vitals: BP (!) 153/101 (BP Location: Right Arm)   Pulse (!) 101   Temp 97.9 F (36.6 C) (Temporal)   Resp 19   SpO2 99%   Physical Exam Constitutional:      General: He is not in acute distress.    Appearance: Normal appearance. He is well-developed and normal weight. He is not ill-appearing, toxic-appearing or diaphoretic.  HENT:     Head: Normocephalic and atraumatic.     Right Ear: Tympanic membrane, ear canal and external ear normal. There is no impacted cerumen.     Left Ear: Tympanic membrane, ear canal and external ear normal. There is no impacted cerumen.     Nose: Congestion and rhinorrhea present.     Mouth/Throat:     Mouth: Mucous membranes are moist.     Pharynx: No oropharyngeal exudate or posterior oropharyngeal erythema.     Comments: Significant postnasal drainage in oropharynx. Eyes:     General: No scleral icterus.       Right eye: No discharge.        Left eye: No discharge.     Extraocular Movements: Extraocular movements intact.     Conjunctiva/sclera: Conjunctivae normal.     Pupils: Pupils are equal, round, and reactive to light.  Neck:     Musculoskeletal: Normal range of motion and neck  supple. No neck rigidity or muscular tenderness.  Cardiovascular:     Rate and Rhythm: Normal rate and regular rhythm.     Heart sounds: Normal heart sounds. No murmur. No friction rub. No gallop.   Pulmonary:     Effort: Pulmonary effort is normal. No respiratory distress.     Breath sounds: Normal breath sounds. No stridor. No wheezing, rhonchi or rales.  Musculoskeletal:     Right lower leg: No edema.     Left lower leg: No edema.  Skin:    General: Skin is warm and dry.  Neurological:      General: No focal deficit present.     Mental Status: He is alert and oriented to person, place, and time.  Psychiatric:        Mood and Affect: Mood normal.        Behavior: Behavior normal.        Thought Content: Thought content normal.        Judgment: Judgment normal.     Results for orders placed or performed during the hospital encounter of 08/09/19 (from the past 24 hour(s))  POCT rapid strep A [facility])     Status: None   Collection Time: 08/09/19 10:28 AM  Result Value Ref Range   Streptococcus, Group A Screen (Direct) NEGATIVE NEGATIVE    Assessment and Plan :   1. Viral URI with cough   2. Nasal congestion   3. Throat pain   4. Cough   5. Essential hypertension   6. Elevated blood pressure reading in office with diagnosis of hypertension     Suspect viral URI, labs pending. Will manage for viral illness. Counseled patient on nature of COVID-19 including modes of transmission, diagnostic testing, management and supportive care.  Offered symptomatic relief. COVID 19 testing is pending. Counseled patient on potential for adverse effects with medications prescribed/recommended today, ER and return-to-clinic precautions discussed, patient verbalized understanding.  Patient [facility] and [facility].  Recommended he follow-up ASAP for his HTN.   Wallis BambergMani, Annison Birchard, PA-C 08/09/19 1048

## 2019-08-11 LAB — CULTURE, GROUP A STREP (THRC)

## 2019-08-11 LAB — NOVEL CORONAVIRUS, NAA (HOSP ORDER, SEND-OUT TO REF LAB; TAT 18-24 HRS): SARS-CoV-2, NAA: NOT DETECTED

## 2020-02-07 ENCOUNTER — Other Ambulatory Visit: Payer: Self-pay

## 2020-02-07 ENCOUNTER — Emergency Department (HOSPITAL_COMMUNITY): Payer: No Typology Code available for payment source

## 2020-02-07 DIAGNOSIS — M79672 Pain in left foot: Secondary | ICD-10-CM | POA: Diagnosis present

## 2020-02-07 DIAGNOSIS — R2242 Localized swelling, mass and lump, left lower limb: Secondary | ICD-10-CM | POA: Insufficient documentation

## 2020-02-07 DIAGNOSIS — Z79899 Other long term (current) drug therapy: Secondary | ICD-10-CM | POA: Diagnosis not present

## 2020-02-07 DIAGNOSIS — Z87891 Personal history of nicotine dependence: Secondary | ICD-10-CM | POA: Insufficient documentation

## 2020-02-07 DIAGNOSIS — I10 Essential (primary) hypertension: Secondary | ICD-10-CM | POA: Diagnosis not present

## 2020-02-07 DIAGNOSIS — F1210 Cannabis abuse, uncomplicated: Secondary | ICD-10-CM | POA: Diagnosis not present

## 2020-02-07 DIAGNOSIS — M109 Gout, unspecified: Secondary | ICD-10-CM | POA: Insufficient documentation

## 2020-02-07 NOTE — ED Triage Notes (Signed)
Arrived by EMS from home. Patient reports left foot pain and swelling X3 days. Patient states this happened 3 weeks ago and it resolved on its own. Patient thinks he may have gout but has not been diagnosed with it.

## 2020-02-08 ENCOUNTER — Emergency Department (HOSPITAL_COMMUNITY)
Admission: EM | Admit: 2020-02-08 | Discharge: 2020-02-08 | Disposition: A | Discharge status: Home / Self Care | Payer: No Typology Code available for payment source | Attending: Emergency Medicine | Admitting: Emergency Medicine

## 2020-02-08 DIAGNOSIS — M79672 Pain in left foot: Secondary | ICD-10-CM

## 2020-02-08 DIAGNOSIS — M109 Gout, unspecified: Secondary | ICD-10-CM

## 2020-02-08 DIAGNOSIS — I10 Essential (primary) hypertension: Secondary | ICD-10-CM

## 2020-02-08 MED ORDER — PREDNISONE 20 MG PO TABS: ORAL_TABLET | ORAL | 0 refills | Status: DC

## 2020-02-08 MED ORDER — OXYCODONE HCL 5 MG PO TABS: 5.0000 mg | ORAL_TABLET | Freq: Four times a day (QID) | ORAL | 0 refills | Status: DC | PRN

## 2020-02-08 MED ORDER — PREDNISONE 20 MG PO TABS
60.0000 mg | ORAL_TABLET | Freq: Once | ORAL | Status: AC
  Administered 2020-02-08: 60 mg via ORAL
  Filled 2020-02-08: qty 3

## 2020-02-08 MED ORDER — OXYCODONE HCL 5 MG PO TABS
10.0000 mg | ORAL_TABLET | Freq: Once | ORAL | Status: AC
  Administered 2020-02-08: 10 mg via ORAL
  Filled 2020-02-08: qty 2

## 2020-02-08 NOTE — ED Provider Notes (Signed)
[facility]-EMERGENCY DEPT Provider Note   CSN: [id number] Arrival date & time: 02/07/20  2155     History Chief Complaint  Patient presents with  . Foot Pain    left foot    Scott Fischer is a 44 y.o. male with a hx of hypertension, PTSD presents to the Emergency Department complaining of gradual, persistent, progressively worsening left foot swelling and pain onset 3 days ago.  Patient reports initially ibuprofen helped the pain but it is stopped helping since yesterday.  Patient reports his initial pain was in the left MTP.  It was associated with some increased warmth and swelling.  Pain was worsened and continues to be worsened with walking.  Patient reports with some rest and elevation the swelling began to decrease however he was up yesterday painting his house and swelling worsened significantly last night and today.  He reports his entire foot is swollen now and generally tender.  He has no history of gout but does drink alcohol.  Patient denies fevers or chills, no injury, numbness, tingling or weakness.  Patient denies leg swelling, calf tenderness, history of DVT, periods of immobilization. Pt reports pain is so bad now that he cannot walk.  The history is provided by the patient and medical records. No language interpreter was used.       Past Medical History:  Diagnosis Date  . Depression   . Difficulty controlling anger   . Hypertension   . PTSD (post-traumatic stress disorder)   . Suicidal behavior     Patient Active Problem List   Diagnosis Date Noted  . Alcohol dependence with alcohol-induced mood disorder (HCC) 11/08/2016    Past Surgical History:  Procedure Laterality Date  . EYE SURGERY         Family History  Problem Relation Age of Onset  . Diabetes Mother   . Hypertension Mother   . Hypertension Father     Social History   Tobacco Use  . Smoking status: Former Games developer  . Smokeless tobacco: Never Used   Substance Use Topics  . Alcohol use: Yes    Comment: 6 pack daily  . Drug use: Yes    Types: Marijuana    Comment:      Home Medications Prior to Admission medications   Medication Sig Start Date End Date Taking? Authorizing Provider  benzonatate (TESSALON) 100 MG capsule Take 1-2 capsules (100-200 mg total) by mouth 3 (three) times daily as needed. 08/09/19   Wallis Bamberg, PA-C  carbamazepine (TEGRETOL XR) 200 MG 12 hr tablet Take 1 tablet (200 mg total) by mouth 2 (two) times daily. 01/23/17   Charm Rings, NP  gabapentin (NEURONTIN) 300 MG capsule Take 1 capsule (300 mg total) by mouth 3 (three) times daily. 01/23/17   Charm Rings, NP  hydrochlorothiazide (HYDRODIURIL) 25 MG tablet Take 1 tablet (25 mg total) by mouth daily. 02/01/17   Horace Wishon, Dahlia Client, PA-C  naproxen (NAPROSYN) 500 MG tablet Take 1 tablet (500 mg total) by mouth 2 (two) times daily. 08/09/19   Wallis Bamberg, PA-C  ondansetron (ZOFRAN-ODT) 8 MG disintegrating tablet Take 1 tablet (8 mg total) by mouth every 8 (eight) hours as needed for nausea or vomiting. 08/09/19   Wallis Bamberg, PA-C  oxyCODONE (ROXICODONE) 5 MG immediate release tablet Take 1 tablet (5 mg total) by mouth every 6 (six) hours as needed for severe pain. 02/08/20   Collene Massimino, Dahlia Client, PA-C  potassium chloride SA (K-DUR,KLOR-CON) 20 MEQ tablet Take  1 tablet (20 mEq total) by mouth daily. 02/01/17   Asheley Hellberg, Jarrett Soho, PA-C  predniSONE (DELTASONE) 20 MG tablet 3 tabs po daily x 3 days, then 2 tabs x 3 days, then 1.5 tabs x 3 days, then 1 tab x 3 days, then 0.5 tabs x 3 days 02/08/20   Dyllan Kats, Jarrett Soho, PA-C  promethazine-dextromethorphan (PROMETHAZINE-DM) 6.25-15 MG/5ML syrup Take 5 mLs by mouth 3 (three) times daily as needed for cough. 08/09/19   Jaynee Eagles, PA-C  ARIPiprazole (ABILIFY) 10 MG tablet Take 1 tablet (10 mg total) by mouth at bedtime. 01/23/17 08/09/19  Patrecia Pour, NP  traZODone (DESYREL) 50 MG tablet Take 1 tablet (50 mg total) by mouth  at bedtime. 01/23/17 08/09/19  Patrecia Pour, NP    Allergies    Patient has no known allergies.  Review of Systems   Review of Systems  Constitutional: Negative for chills and fever.  Gastrointestinal: Negative for nausea and vomiting.  Musculoskeletal: Positive for arthralgias, gait problem and joint swelling. Negative for back pain, neck pain and neck stiffness.  Skin: Negative for wound.  Neurological: Negative for numbness.  Hematological: Does not bruise/bleed easily.  Psychiatric/Behavioral: The patient is not nervous/anxious.   All other systems reviewed and are negative.   Physical Exam Updated Vital Signs BP (!) 167/110   Pulse 92   Temp 99.8 F (37.7 C) (Oral)   Resp 18   SpO2 99%   Physical Exam Vitals and nursing note reviewed.  Constitutional:      General: He is not in acute distress.    Appearance: He is well-developed. He is not diaphoretic.  HENT:     Head: Normocephalic and atraumatic.  Eyes:     Conjunctiva/sclera: Conjunctivae normal.  Cardiovascular:     Rate and Rhythm: Normal rate and regular rhythm.     Comments: Capillary refill < 3 sec Pulmonary:     Effort: Pulmonary effort is normal.     Breath sounds: Normal breath sounds.  Musculoskeletal:        General: Tenderness present.     Cervical back: Normal range of motion.     Left knee: Normal.     Left lower leg: Normal. No swelling or tenderness. No edema.     Left ankle: Swelling (minimal) present. No deformity or ecchymosis. No tenderness. Normal range of motion.     Left Achilles Tendon: Normal.     Left foot: Decreased range of motion ( decreased ROM of the great toe due to pain). Swelling (locally over the MTP with minimal swelling of the entire foot) and tenderness ( over the MTP) present.  Skin:    General: Skin is warm and dry.     Comments: No tenting of the skin  Neurological:     Mental Status: He is alert.     Coordination: Coordination normal.     Comments: Sensation  intact to normal touch in the LLE Strength 5/5 with dorsiflexion and plantarflexion of the ankle.  Strength not tested at the toe due to severe pain.     ED Results / Procedures / Treatments    Radiology DG Foot Complete Left  Result Date: 02/07/2020 CLINICAL DATA:  Foot pain swollen EXAM: LEFT FOOT - COMPLETE 3+ VIEW COMPARISON:  None. FINDINGS: No fracture or malalignment. Mild degenerative change at the first MTP joint. Small erosion at the head of the first proximal phalanx. Diffuse soft tissue swelling. Ossicle or old injury along the dorsal navicular. IMPRESSION: 1. Diffuse soft  tissue swelling.  No acute fracture is seen. 2. Small erosive change at head of first proximal phalanx suggesting inflammatory arthropathy or crystal disease. Electronically Signed   By: Jasmine Pang M.D.   On: 02/07/2020 23:49    Procedures Procedures (including critical care time)  Medications Ordered in ED Medications  oxyCODONE (Oxy IR/ROXICODONE) immediate release tablet 10 mg (10 mg Oral Given 02/08/20 0251)  predniSONE (DELTASONE) tablet 60 mg (60 mg Oral Given 02/08/20 0250)    ED Course  I have reviewed the triage vital signs and the nursing notes.  Pertinent labs & imaging results that were available during my care of the patient were reviewed by me and considered in my medical decision making (see chart for details).  Clinical Course as of Feb 07 334  Sat Feb 08, 2020  0330 Patient noted to be hypertensive.  He has history of same and reports he has been taking his medication.  No chest pain or shortness of breath.  Patient will need close primary care follow-up for repeat blood pressure check and medication adjustment as needed.  BP(!): 167/110 [HM]    Clinical Course User Index [HM] Milania Haubner, Boyd Kerbs   MDM Rules/Calculators/A&P                       Patient presents with left foot pain and swelling.  Initial pain and swelling was in the left MTP.  No trauma noted.  X-ray with  no fracture.  Small erosive change at the head of the first proximal phalanx most consistent with crystal disease.  History and clinical exam consistent with gout.  Patient is without history of diabetes.  Will give prednisone, pain control and have patient follow with podiatry.  No clinical evidence of septic joint.  No peripheral edema of the calf or calf tenderness.  Doubt DVT.  Patient request crutches as it is painful to walk.  This is reasonable.  Discussed reasons to return immediately to the emergency department.   Final Clinical Impression(s) / ED Diagnoses Final diagnoses:  Foot pain, left  Acute gout involving toe of left foot, unspecified cause  Essential hypertension    Rx / DC Orders ED Discharge Orders         Ordered    predniSONE (DELTASONE) 20 MG tablet     02/08/20 0334    oxyCODONE (ROXICODONE) 5 MG immediate release tablet  Every 6 hours PRN     02/08/20 0334           Jaimy Kliethermes, Dahlia Client, PA-C 02/08/20 0335    Mesner, Barbara Cower, MD 02/08/20 [phone]

## 2020-02-08 NOTE — Discharge Instructions (Signed)
1. Medications: Oxycodone for breakthrough pain, prednisone, usual home medications 2. Treatment: rest, drink plenty of fluids,  3. Follow Up: Please followup with your primary doctor in 1 week for further evaluation of your high blood pressure.  Please also follow-up with the podiatrist for further evaluation of your foot swelling. Please return to the ER for fevers, worsening swelling, open wounds or other concerns

## 2020-11-24 ENCOUNTER — Emergency Department (HOSPITAL_COMMUNITY)
Admission: EM | Admit: 2020-11-24 | Discharge: 2020-11-24 | Disposition: A | Discharge status: Home / Self Care | Payer: No Typology Code available for payment source | Attending: Emergency Medicine | Admitting: Emergency Medicine

## 2020-11-24 ENCOUNTER — Encounter (HOSPITAL_COMMUNITY): Payer: Self-pay | Admitting: Emergency Medicine

## 2020-11-24 DIAGNOSIS — Z79899 Other long term (current) drug therapy: Secondary | ICD-10-CM | POA: Diagnosis not present

## 2020-11-24 DIAGNOSIS — M109 Gout, unspecified: Secondary | ICD-10-CM

## 2020-11-24 DIAGNOSIS — M79672 Pain in left foot: Secondary | ICD-10-CM | POA: Diagnosis not present

## 2020-11-24 DIAGNOSIS — I10 Essential (primary) hypertension: Secondary | ICD-10-CM | POA: Diagnosis not present

## 2020-11-24 DIAGNOSIS — Z87891 Personal history of nicotine dependence: Secondary | ICD-10-CM | POA: Insufficient documentation

## 2020-11-24 MED ORDER — PREDNISONE 20 MG PO TABS
40.0000 mg | ORAL_TABLET | Freq: Once | ORAL | Status: AC
  Administered 2020-11-24: 22:00:00 40 mg via ORAL
  Filled 2020-11-24: qty 2

## 2020-11-24 MED ORDER — HYDROCODONE-ACETAMINOPHEN 5-325 MG PO TABS
2.0000 | ORAL_TABLET | Freq: Once | ORAL | Status: AC
  Administered 2020-11-24: 22:00:00 2 via ORAL
  Filled 2020-11-24: qty 2

## 2020-11-24 MED ORDER — HYDROCODONE-ACETAMINOPHEN 5-325 MG PO TABS: 1.0000 | ORAL_TABLET | Freq: Four times a day (QID) | ORAL | 0 refills | Status: DC | PRN

## 2020-11-24 MED ORDER — PREDNISONE 10 MG PO TABS: 20.0000 mg | ORAL_TABLET | Freq: Two times a day (BID) | ORAL | 0 refills | Status: DC

## 2020-11-24 NOTE — Discharge Instructions (Signed)
Begin taking prednisone as prescribed and hydrocodone as prescribed as needed for pain.  Follow-up with your primary doctor if symptoms or not improving in the next 3 to 4 days, and return to the ER if symptoms worsen or change.

## 2020-11-24 NOTE — ED Provider Notes (Signed)
[facility]-EMERGENCY DEPT Provider Note   CSN: [id number] Arrival date & time: 11/24/20  1555     History Chief Complaint  Patient presents with  . Foot Pain    Scott Fischer is a 44 y.o. male.  Patient is a 44 year old male with past medical history of hypertension, depression, PTSD.  He presents today for evaluation of left foot pain.  He describes swelling to his proximal foot/ankle that has worsened over the past several days.  He denies any injury or trauma.  He has had gout in the past and this feels the same.  He denies fevers or chills.  The history is provided by the patient.  Foot Pain This is a recurrent problem. Episode onset: 3 days ago. The problem occurs constantly. The problem has been gradually worsening. The symptoms are aggravated by walking. Nothing relieves the symptoms. He has tried nothing for the symptoms.       Past Medical History:  Diagnosis Date  . Depression   . Difficulty controlling anger   . Hypertension   . PTSD (post-traumatic stress disorder)   . Suicidal behavior     Patient Active Problem List   Diagnosis Date Noted  . Alcohol dependence with alcohol-induced mood disorder (HCC) 11/08/2016    Past Surgical History:  Procedure Laterality Date  . EYE SURGERY         Family History  Problem Relation Age of Onset  . Diabetes Mother   . Hypertension Mother   . Hypertension Father     Social History   Tobacco Use  . Smoking status: Former Games developer  . Smokeless tobacco: Never Used  Vaping Use  . Vaping Use: Never used  Substance Use Topics  . Alcohol use: Yes    Comment: 6 pack daily  . Drug use: Yes    Types: Marijuana    Comment:      Home Medications Prior to Admission medications   Medication Sig Start Date End Date Taking? Authorizing Provider  benzonatate (TESSALON) 100 MG capsule Take 1-2 capsules (100-200 mg total) by mouth 3 (three) times daily as needed. 08/09/19   Wallis Bamberg,  PA-C  carbamazepine (TEGRETOL XR) 200 MG 12 hr tablet Take 1 tablet (200 mg total) by mouth 2 (two) times daily. 01/23/17   Charm Rings, NP  gabapentin (NEURONTIN) 300 MG capsule Take 1 capsule (300 mg total) by mouth 3 (three) times daily. 01/23/17   Charm Rings, NP  hydrochlorothiazide (HYDRODIURIL) 25 MG tablet Take 1 tablet (25 mg total) by mouth daily. 02/01/17   Muthersbaugh, Dahlia Client, PA-C  naproxen (NAPROSYN) 500 MG tablet Take 1 tablet (500 mg total) by mouth 2 (two) times daily. 08/09/19   Wallis Bamberg, PA-C  ondansetron (ZOFRAN-ODT) 8 MG disintegrating tablet Take 1 tablet (8 mg total) by mouth every 8 (eight) hours as needed for nausea or vomiting. 08/09/19   Wallis Bamberg, PA-C  oxyCODONE (ROXICODONE) 5 MG immediate release tablet Take 1 tablet (5 mg total) by mouth every 6 (six) hours as needed for severe pain. 02/08/20   Muthersbaugh, Dahlia Client, PA-C  potassium chloride SA (K-DUR,KLOR-CON) 20 MEQ tablet Take 1 tablet (20 mEq total) by mouth daily. 02/01/17   Muthersbaugh, Dahlia Client, PA-C  predniSONE (DELTASONE) 20 MG tablet 3 tabs po daily x 3 days, then 2 tabs x 3 days, then 1.5 tabs x 3 days, then 1 tab x 3 days, then 0.5 tabs x 3 days 02/08/20   Muthersbaugh, Dahlia Client, PA-C  promethazine-dextromethorphan (  PROMETHAZINE-DM) 6.25-15 MG/5ML syrup Take 5 mLs by mouth 3 (three) times daily as needed for cough. 08/09/19   Wallis Bamberg, PA-C  ARIPiprazole (ABILIFY) 10 MG tablet Take 1 tablet (10 mg total) by mouth at bedtime. 01/23/17 08/09/19  Charm Rings, NP  traZODone (DESYREL) 50 MG tablet Take 1 tablet (50 mg total) by mouth at bedtime. 01/23/17 08/09/19  Charm Rings, NP    Allergies    Patient has no known allergies.  Review of Systems   Review of Systems  All other systems reviewed and are negative.   Physical Exam Updated Vital Signs BP (!) 140/96 (BP Location: Left Arm)   Pulse 92   Temp 98.9 F (37.2 C) (Oral)   Resp 17   SpO2 100%   Physical Exam Vitals and nursing note  reviewed.  Constitutional:      General: He is not in acute distress.    Appearance: Normal appearance. He is not ill-appearing.  HENT:     Head: Normocephalic and atraumatic.  Pulmonary:     Effort: Pulmonary effort is normal.  Musculoskeletal:     Comments: There is swelling and warmth to the left ankle.  He has pain with range of motion.  DP pulses are palpable and motor and sensation are intact in the entire foot.  Skin:    General: Skin is warm and dry.  Neurological:     Mental Status: He is alert and oriented to person, place, and time.     ED Results / Procedures / Treatments   Labs (all labs ordered are listed, but only abnormal results are displayed) Labs Reviewed - No data to display  EKG None  Radiology No results found.  Procedures Procedures (including critical care time)  Medications Ordered in ED Medications  predniSONE (DELTASONE) tablet 40 mg (has no administration in time range)  HYDROcodone-acetaminophen (NORCO/VICODIN) 5-325 MG per tablet 2 tablet (has no administration in time range)    ED Course  I have reviewed the triage vital signs and the nursing notes.  Pertinent labs & imaging results that were available during my care of the patient were reviewed by me and considered in my medical decision making (see chart for details).    MDM Rules/Calculators/A&P  This appears to be a recurrence of gout.  This will be treated with prednisone and Vicodin.  Patient to follow-up as needed if not improving and return to the ER if symptoms worsen.  Final Clinical Impression(s) / ED Diagnoses Final diagnoses:  None    Rx / DC Orders ED Discharge Orders    None       Geoffery Lyons, MD 11/24/20 2120

## 2020-11-24 NOTE — ED Triage Notes (Signed)
Per EMS-gout flare up 3 days ago-pain and swelling in left foot-doe not take medication-last episode was 2 months ago

## 2021-02-23 ENCOUNTER — Other Ambulatory Visit: Payer: Self-pay

## 2021-02-23 ENCOUNTER — Emergency Department (HOSPITAL_COMMUNITY)
Admission: EM | Admit: 2021-02-23 | Discharge: 2021-02-23 | Disposition: A | Discharge status: Home / Self Care | Payer: No Typology Code available for payment source | Attending: Emergency Medicine | Admitting: Emergency Medicine

## 2021-02-23 ENCOUNTER — Encounter (HOSPITAL_COMMUNITY): Payer: Self-pay

## 2021-02-23 DIAGNOSIS — M109 Gout, unspecified: Secondary | ICD-10-CM

## 2021-02-23 DIAGNOSIS — Z87891 Personal history of nicotine dependence: Secondary | ICD-10-CM | POA: Diagnosis not present

## 2021-02-23 DIAGNOSIS — Z79899 Other long term (current) drug therapy: Secondary | ICD-10-CM | POA: Diagnosis not present

## 2021-02-23 DIAGNOSIS — I10 Essential (primary) hypertension: Secondary | ICD-10-CM | POA: Diagnosis not present

## 2021-02-23 DIAGNOSIS — M10072 Idiopathic gout, left ankle and foot: Secondary | ICD-10-CM | POA: Insufficient documentation

## 2021-02-23 DIAGNOSIS — M25572 Pain in left ankle and joints of left foot: Secondary | ICD-10-CM | POA: Diagnosis present

## 2021-02-23 MED ORDER — OXYCODONE-ACETAMINOPHEN 5-325 MG PO TABS
1.0000 | ORAL_TABLET | Freq: Once | ORAL | Status: AC
  Administered 2021-02-23: 1 via ORAL
  Filled 2021-02-23: qty 1

## 2021-02-23 MED ORDER — OXYCODONE-ACETAMINOPHEN 5-325 MG PO TABS: 1.0000 | ORAL_TABLET | ORAL | 0 refills | Status: AC | PRN

## 2021-02-23 MED ORDER — PREDNISONE 20 MG PO TABS: 40.0000 mg | ORAL_TABLET | Freq: Every day | ORAL | 0 refills | Status: AC

## 2021-02-23 MED ORDER — PREDNISONE 20 MG PO TABS
30.0000 mg | ORAL_TABLET | Freq: Once | ORAL | Status: AC
  Administered 2021-02-23: 30 mg via ORAL
  Filled 2021-02-23: qty 2

## 2021-02-23 NOTE — ED Provider Notes (Signed)
[facility]-EMERGENCY DEPT Provider Note   CSN: [id number] Arrival date & time: 02/23/21  1635     History Chief Complaint  Patient presents with  . Gout    Scott Fischer is a 45 y.o. male.  58 y,o male with a PMH of Depression, PTSD, HTN presents to the ED with a chief complaint of left foot pain x3 days ago.  Patient reports pain along the ankle, left toe, which is similar to his previous episodes of gout.  Pain is exacerbated with movement, ambulation.  He has taken 10 mg of prednisone that he had leftover from his prior flare without much improvement. Alleviated with rest. He denies any fever, trauma or other complaints.   The history is provided by the patient.       Past Medical History:  Diagnosis Date  . Depression   . Difficulty controlling anger   . Hypertension   . PTSD (post-traumatic stress disorder)   . Suicidal behavior     Patient Active Problem List   Diagnosis Date Noted  . Alcohol dependence with alcohol-induced mood disorder (HCC) 11/08/2016    Past Surgical History:  Procedure Laterality Date  . EYE SURGERY         Family History  Problem Relation Age of Onset  . Diabetes Mother   . Hypertension Mother   . Hypertension Father     Social History   Tobacco Use  . Smoking status: Former Games developer  . Smokeless tobacco: Never Used  Vaping Use  . Vaping Use: Never used  Substance Use Topics  . Alcohol use: Yes    Comment: 6 pack daily  . Drug use: Yes    Types: Marijuana    Comment:      Home Medications Prior to Admission medications   Medication Sig Start Date End Date Taking? Authorizing Provider  oxyCODONE-acetaminophen (PERCOCET/ROXICET) 5-325 MG tablet Take 1 tablet by mouth every 4 (four) hours as needed for up to 3 days for severe pain. 02/23/21 02/26/21 Yes Maxson Oddo, Leonie Douglas, PA-C  predniSONE (DELTASONE) 20 MG tablet Take 2 tablets (40 mg total) by mouth daily for 4 days. 02/23/21 02/27/21 Yes Larri Brewton,  PA-C  benzonatate (TESSALON) 100 MG capsule Take 1-2 capsules (100-200 mg total) by mouth 3 (three) times daily as needed. 08/09/19   Wallis Bamberg, PA-C  carbamazepine (TEGRETOL XR) 200 MG 12 hr tablet Take 1 tablet (200 mg total) by mouth 2 (two) times daily. 01/23/17   Charm Rings, NP  gabapentin (NEURONTIN) 300 MG capsule Take 1 capsule (300 mg total) by mouth 3 (three) times daily. 01/23/17   Charm Rings, NP  hydrochlorothiazide (HYDRODIURIL) 25 MG tablet Take 1 tablet (25 mg total) by mouth daily. 02/01/17   Muthersbaugh, Dahlia Client, PA-C  naproxen (NAPROSYN) 500 MG tablet Take 1 tablet (500 mg total) by mouth 2 (two) times daily. 08/09/19   Wallis Bamberg, PA-C  ondansetron (ZOFRAN-ODT) 8 MG disintegrating tablet Take 1 tablet (8 mg total) by mouth every 8 (eight) hours as needed for nausea or vomiting. 08/09/19   Wallis Bamberg, PA-C  potassium chloride SA (K-DUR,KLOR-CON) 20 MEQ tablet Take 1 tablet (20 mEq total) by mouth daily. 02/01/17   Muthersbaugh, Dahlia Client, PA-C  promethazine-dextromethorphan (PROMETHAZINE-DM) 6.25-15 MG/5ML syrup Take 5 mLs by mouth 3 (three) times daily as needed for cough. 08/09/19   Wallis Bamberg, PA-C  ARIPiprazole (ABILIFY) 10 MG tablet Take 1 tablet (10 mg total) by mouth at bedtime. 01/23/17 08/09/19  Nanine Means  Y, NP  traZODone (DESYREL) 50 MG tablet Take 1 tablet (50 mg total) by mouth at bedtime. 01/23/17 08/09/19  Charm Rings, NP    Allergies    Patient has no known allergies.  Review of Systems   Review of Systems  Constitutional: Negative for fever.  HENT: Negative for sore throat.   Respiratory: Negative for shortness of breath.   Cardiovascular: Negative for chest pain.  Gastrointestinal: Negative for abdominal pain and vomiting.  Genitourinary: Negative for flank pain.  Musculoskeletal: Positive for arthralgias.  Neurological: Negative for headaches.  All other systems reviewed and are negative.   Physical Exam Updated Vital Signs BP (!) 147/87    Pulse 81   Temp 98.3 F (36.8 C) (Oral)   Resp 18   Ht 6' (1.829 m)   Wt 108 kg   SpO2 96%   BMI 32.28 kg/m   Physical Exam Vitals and nursing note reviewed.  Constitutional:      Appearance: Normal appearance.  HENT:     Head: Normocephalic and atraumatic.     Mouth/Throat:     Mouth: Mucous membranes are moist.  Cardiovascular:     Pulses:          Dorsalis pedis pulses are 2+ on the left side.       Posterior tibial pulses are 2+ on the left side.  Pulmonary:     Effort: Pulmonary effort is normal.  Abdominal:     General: Abdomen is flat.  Musculoskeletal:        General: Swelling and tenderness present.     Cervical back: Normal range of motion and neck supple.  Feet:     Left foot:     Skin integrity: Skin integrity normal. No ulcer, blister, skin breakdown, erythema, warmth or callus.     Toenail Condition: Left toenails are normal.     Comments: Pulses present, capillary refill is intact.  Pain with palpation along the ankle joint with minimal swelling noted.  No skin thickening or streaking.  Skin:    General: Skin is warm and dry.  Neurological:     Mental Status: He is alert and oriented to person, place, and time.     ED Results / Procedures / Treatments   Labs (all labs ordered are listed, but only abnormal results are displayed) Labs Reviewed - No data to display  EKG None  Radiology No results found.  Procedures Procedures   Medications Ordered in ED Medications  predniSONE (DELTASONE) tablet 30 mg (30 mg Oral Given 02/23/21 1758)  oxyCODONE-acetaminophen (PERCOCET/ROXICET) 5-325 MG per tablet 1 tablet (1 tablet Oral Given 02/23/21 1758)    ED Course  I have reviewed the triage vital signs and the nursing notes.  Pertinent labs & imaging results that were available during my care of the patient were reviewed by me and considered in my medical decision making (see chart for details).    MDM Rules/Calculators/A&P    Presents to the ED  with left foot pain which began 3 days ago, describes his symptoms and pain similar to his prior gout episodes.  Reports he had leftover prednisone which he took 10 mg of.  He reports no trauma, injury, fever, other complaints.  During evaluation foot is neurovascularly intact, there is pain with range of motion, pulses are present and capillary refill is intact.  No skin changes, or streaking to suggest cellulitis.  He denies any trauma, do not feel that we need imaging at this time.  We discussed acute treatment with steroids along with pain medication for pain control.  He is educated to follow-up with PCP in order to obtain prophylactic medication such as allopurinol to help with further episodes.  Vitals are within normal limits, heart rate is slightly elevated at 99, suspect likely due to pain.  6:32 PM patient reevaluated by me, described improvement in symptoms.  He was previously prescribed Norco, however there is a shortage of this medication to try area, therefore he was given Percocet for pain relief.  Patient understands and agrees with management, return precautions discussed at length   Portions of this note were generated with Dragon dictation software. Dictation errors may occur despite best attempts at proofreading.  Final Clinical Impression(s) / ED Diagnoses Final diagnoses:  Acute gout of left ankle, unspecified cause    Rx / DC Orders ED Discharge Orders         Ordered    predniSONE (DELTASONE) 20 MG tablet  Daily        02/23/21 1749    oxyCODONE-acetaminophen (PERCOCET/ROXICET) 5-325 MG tablet  Every 4 hours PRN        02/23/21 1749           Claude Manges, PA-C 02/23/21 1832    Rolan Bucco, MD 02/23/21 1907

## 2021-02-23 NOTE — Discharge Instructions (Signed)
I have prescribed a short course of steroids to help with your acute gout flare up, please take 2 tablets daily for the next 4 days.  I have also prescribed medication to help with pain, please take this for severe pain. DO NOT drink alcohol or drive while taking this medication.

## 2021-02-23 NOTE — ED Triage Notes (Signed)
Patient c/o gout in his left foot x 3 days.

## 2021-09-01 ENCOUNTER — Emergency Department (HOSPITAL_COMMUNITY): Payer: No Typology Code available for payment source

## 2021-09-01 ENCOUNTER — Emergency Department (HOSPITAL_COMMUNITY)
Admission: EM | Admit: 2021-09-01 | Discharge: 2021-09-01 | Disposition: A | Discharge status: Home / Self Care | Payer: No Typology Code available for payment source | Attending: Emergency Medicine | Admitting: Emergency Medicine

## 2021-09-01 ENCOUNTER — Other Ambulatory Visit: Payer: Self-pay

## 2021-09-01 ENCOUNTER — Encounter (HOSPITAL_COMMUNITY): Payer: Self-pay | Admitting: Emergency Medicine

## 2021-09-01 DIAGNOSIS — M542 Cervicalgia: Secondary | ICD-10-CM | POA: Diagnosis present

## 2021-09-01 DIAGNOSIS — Z5321 Procedure and treatment not carried out due to patient leaving prior to being seen by health care provider: Secondary | ICD-10-CM | POA: Insufficient documentation

## 2021-09-01 DIAGNOSIS — R0789 Other chest pain: Secondary | ICD-10-CM | POA: Diagnosis not present

## 2021-09-01 DIAGNOSIS — M545 Low back pain, unspecified: Secondary | ICD-10-CM | POA: Diagnosis not present

## 2021-09-01 LAB — CBC WITH DIFFERENTIAL/PLATELET
Abs Immature Granulocytes: 0.02 10*3/uL (ref 0.00–0.07)
Basophils Absolute: 0.1 10*3/uL (ref 0.0–0.1)
Basophils Relative: 1 %
Eosinophils Absolute: 0.1 10*3/uL (ref 0.0–0.5)
Eosinophils Relative: 2 %
HCT: 41.8 % (ref 39.0–52.0)
Hemoglobin: 14.8 g/dL (ref 13.0–17.0)
Immature Granulocytes: 0 %
Lymphocytes Relative: 41 %
Lymphs Abs: 2.1 10*3/uL (ref 0.7–4.0)
MCH: 32.3 pg (ref 26.0–34.0)
MCHC: 35.4 g/dL (ref 30.0–36.0)
MCV: 91.3 fL (ref 80.0–100.0)
Monocytes Absolute: 0.3 10*3/uL (ref 0.1–1.0)
Monocytes Relative: 6 %
Neutro Abs: 2.5 10*3/uL (ref 1.7–7.7)
Neutrophils Relative %: 50 %
Platelets: 218 10*3/uL (ref 150–400)
RBC: 4.58 MIL/uL (ref 4.22–5.81)
RDW: 12.5 % (ref 11.5–15.5)
WBC: 5.2 10*3/uL (ref 4.0–10.5)
nRBC: 0 % (ref 0.0–0.2)

## 2021-09-01 LAB — TROPONIN I (HIGH SENSITIVITY): Troponin I (High Sensitivity): 13 ng/L (ref <18)

## 2021-09-01 LAB — COMPREHENSIVE METABOLIC PANEL
ALT: 37 U/L (ref 0–44)
AST: 46 U/L — ABNORMAL HIGH (ref 15–41)
Albumin: 4.1 g/dL (ref 3.5–5.0)
Alkaline Phosphatase: 113 U/L (ref 38–126)
Anion gap: 13 (ref 5–15)
BUN: 8 mg/dL (ref 6–20)
CO2: 25 mmol/L (ref 22–32)
Calcium: 8.9 mg/dL (ref 8.9–10.3)
Chloride: 100 mmol/L (ref 98–111)
Creatinine, Ser: 0.99 mg/dL (ref 0.61–1.24)
GFR, Estimated: >60 mL/min (ref >60)
Glucose, Bld: 106 mg/dL — ABNORMAL HIGH (ref 70–99)
Potassium: 3.4 mmol/L — ABNORMAL LOW (ref 3.5–5.1)
Sodium: 138 mmol/L (ref 135–145)
Total Bilirubin: 0.6 mg/dL (ref 0.3–1.2)
Total Protein: 7.8 g/dL (ref 6.5–8.1)

## 2021-09-01 LAB — LIPASE, BLOOD: Lipase: 31 U/L (ref 11–51)

## 2021-09-01 NOTE — ED Notes (Signed)
Patient stated he could not wait any longer, patient was encouraged to stay, patient still decided to leave. Patient's IV was removed and he left.

## 2021-09-01 NOTE — ED Provider Notes (Signed)
Emergency Medicine Provider Triage Evaluation Note  Scott Fischer , a 45 y.o. male  was evaluated in triage.  Pt complains of 3 days of neck pain and central chest pain.  This started after he slept in a recliner.  EMS gave him nitro and aspirin as he feels like the pain radiates in his chest and in his back however when it started it was unilateral neck pain.  He denies any fevers.  No shortness of breath.  He was hypertensive with EMS, has a history of the same.  He states that he called the [place], and they told him to get checked out.  No numbness or weakness.  He has had a headache since he had the nitro with EMS however no headache before him.  Review of Systems  Positive: Chest and back pain, neck pain Negative: Shortness of breath, syncope  Physical Exam  BP (!) 141/97 (BP Location: Left Arm)   Pulse 87   Temp 99.1 F (37.3 C) (Oral)   Resp 18   SpO2 97%  Gen:   Awake, no distress   Resp:  Normal effort, lungs CTAB MSK:   Moves extremities without difficulty, TTP over bilateral trapezius muscles.  Other:  2+ radial pulses bilaterally.   Medical Decision Making  Medically screening exam initiated at 4:14 PM.  Appropriate orders placed.  OCTAVIS SHEELER was informed that the remainder of the evaluation will be completed by another provider, this initial triage assessment does not replace that evaluation, and the importance of remaining in the ED until their evaluation is complete.  Note: Portions of this report may have been transcribed using voice recognition software. Every effort was made to ensure accuracy; however, inadvertent computerized transcription errors may be present    Norman Clay 09/01/21 1616    Wynetta Fines, MD 09/01/21 2303

## 2021-09-01 NOTE — ED Triage Notes (Signed)
Pt BIB [facility] from home, c/o neck pain x 3 days and central, non-radiating chest pain that started today. Given 1NTG and 324mg  asa by EMS pta. Initially hypertensive with EMS, hx of same.

## 2022-01-01 ENCOUNTER — Ambulatory Visit (HOSPITAL_COMMUNITY)
Admission: EM | Admit: 2022-01-01 | Discharge: 2022-01-01 | Disposition: A | Discharge status: Home / Self Care | Payer: No Typology Code available for payment source | Attending: Emergency Medicine | Admitting: Emergency Medicine

## 2022-01-01 ENCOUNTER — Encounter (HOSPITAL_COMMUNITY): Payer: Self-pay | Admitting: Emergency Medicine

## 2022-01-01 ENCOUNTER — Other Ambulatory Visit: Payer: Self-pay

## 2022-01-01 DIAGNOSIS — M10071 Idiopathic gout, right ankle and foot: Secondary | ICD-10-CM | POA: Diagnosis not present

## 2022-01-01 MED ORDER — METHYLPREDNISOLONE SODIUM SUCC 125 MG IJ SOLR
60.0000 mg | Freq: Once | INTRAMUSCULAR | Status: AC
  Administered 2022-01-01: 60 mg via INTRAMUSCULAR

## 2022-01-01 MED ORDER — METHYLPREDNISOLONE SODIUM SUCC 125 MG IJ SOLR
INTRAMUSCULAR | Status: AC
  Filled 2022-01-01: qty 2

## 2022-01-01 MED ORDER — PREDNISONE 20 MG PO TABS: 40.0000 mg | ORAL_TABLET | Freq: Every day | ORAL | 0 refills | Status: DC

## 2022-01-01 NOTE — ED Provider Notes (Signed)
[facility]    CSN: [id number] Arrival date & time: 01/01/22  1521      History   Chief Complaint Chief Complaint  Patient presents with   Foot Swelling    HPI BRAZOS SANDOVAL is a 46 y.o. male.    Patient presents with right central ankle pain and swelling described as a pounding sensation for 3 days.  Range of motion intact But elicits pain with rotation endorses that he increase his intake of beef which is a known trigger for his gout.  Has attempted use of vinegar and Epsom salt which was not effective.  Denies numbness, tingling, prior injury or trauma.     Past Medical History:  Diagnosis Date   Depression    Difficulty controlling anger    Hypertension    PTSD (post-traumatic stress disorder)    Suicidal behavior     Patient Active Problem List   Diagnosis Date Noted   Alcohol dependence with alcohol-induced mood disorder (HCC) 11/08/2016    Past Surgical History:  Procedure Laterality Date   EYE SURGERY         Home Medications    Prior to Admission medications   Medication Sig Start Date End Date Taking? Authorizing Provider  benzonatate (TESSALON) 100 MG capsule Take 1-2 capsules (100-200 mg total) by mouth 3 (three) times daily as needed. 08/09/19   Wallis Bamberg, PA-C  carbamazepine (TEGRETOL XR) 200 MG 12 hr tablet Take 1 tablet (200 mg total) by mouth 2 (two) times daily. 01/23/17   Charm Rings, NP  gabapentin (NEURONTIN) 300 MG capsule Take 1 capsule (300 mg total) by mouth 3 (three) times daily. 01/23/17   Charm Rings, NP  hydrochlorothiazide (HYDRODIURIL) 25 MG tablet Take 1 tablet (25 mg total) by mouth daily. 02/01/17   Muthersbaugh, Dahlia Client, PA-C  naproxen (NAPROSYN) 500 MG tablet Take 1 tablet (500 mg total) by mouth 2 (two) times daily. 08/09/19   Wallis Bamberg, PA-C  ondansetron (ZOFRAN-ODT) 8 MG disintegrating tablet Take 1 tablet (8 mg total) by mouth every 8 (eight) hours as needed for nausea or vomiting. 08/09/19   Wallis Bamberg,  PA-C  potassium chloride SA (K-DUR,KLOR-CON) 20 MEQ tablet Take 1 tablet (20 mEq total) by mouth daily. 02/01/17   Muthersbaugh, Dahlia Client, PA-C  promethazine-dextromethorphan (PROMETHAZINE-DM) 6.25-15 MG/5ML syrup Take 5 mLs by mouth 3 (three) times daily as needed for cough. 08/09/19   Wallis Bamberg, PA-C  ARIPiprazole (ABILIFY) 10 MG tablet Take 1 tablet (10 mg total) by mouth at bedtime. 01/23/17 08/09/19  Charm Rings, NP  traZODone (DESYREL) 50 MG tablet Take 1 tablet (50 mg total) by mouth at bedtime. 01/23/17 08/09/19  Charm Rings, NP    Family History Family History  Problem Relation Age of Onset   Diabetes Mother    Hypertension Mother    Hypertension Father     Social History Social History   Tobacco Use   Smoking status: Former   Smokeless tobacco: Never  Building services engineer Use: Never used  Substance Use Topics   Alcohol use: Yes    Comment: 6 pack daily   Drug use: Yes    Types: Marijuana    Comment:       Allergies   Patient has no known allergies.   Review of Systems Review of Systems  Constitutional: Negative.   Cardiovascular: Negative.   Skin: Negative.   Neurological: Negative.     Physical Exam Triage Vital Signs ED Triage  Vitals  Enc Vitals Group     BP 01/01/22 1606 117/83     Pulse Rate 01/01/22 1606 100     Resp 01/01/22 1606 19     Temp 01/01/22 1606 99.3 F (37.4 C)     Temp src --      SpO2 01/01/22 1606 98 %     Weight --      Height --      Head Circumference --      Peak Flow --      Pain Score 01/01/22 1605 10     Pain Loc --      Pain Edu? --      Excl. in GC? --    No data found.  Updated Vital Signs BP 117/83    Pulse 100    Temp 99.3 F (37.4 C)    Resp 19    SpO2 98%   Visual Acuity Right Eye Distance:   Left Eye Distance:   Bilateral Distance:    Right Eye Near:   Left Eye Near:    Bilateral Near:     Physical Exam Constitutional:      Appearance: Normal appearance.  HENT:     Head: Normocephalic.   Eyes:     Extraocular Movements: Extraocular movements intact.  Pulmonary:     Effort: Pulmonary effort is normal.  Musculoskeletal:     Comments: Mild to moderate swelling noted of the medial lateral aspect of the right ankle, tenderness noted over the medial aspect of the ankle without point tenderness, no ecchymosis, deformity noted, range of motion is intact, 2+ dorsalis pedis, sensation intact  Skin:    General: Skin is warm and dry.  Neurological:     Mental Status: He is alert and oriented to person, place, and time. Mental status is at baseline.  Psychiatric:        Mood and Affect: Mood normal.        Behavior: Behavior normal.     UC Treatments / Results  Labs (all labs ordered are listed, but only abnormal results are displayed) Labs Reviewed - No data to display  EKG   Radiology No results found.  Procedures Procedures (including critical care time)  Medications Ordered in UC Medications - No data to display  Initial Impression / Assessment and Plan / UC Course  I have reviewed the triage vital signs and the nursing notes.  Pertinent labs & imaging results that were available during my care of the patient were reviewed by me and considered in my medical decision making (see chart for details).  Acute idiopathic gout of right ankle  Etiology of symptoms most likely a gout flare versus soft tissue swelling, methylprednisolone injection given in office as it is evening, prednisone oral course for 5 days prescribed for outpatient treatment, may use over-the-counter Tylenol in addition, heating pad in 15-minute intervals, advised patient to tailor her diet to prevent flares, may follow-up with PCP or urgent care for recurring symptoms Final Clinical Impressions(s) / UC Diagnoses   Final diagnoses:  None   Discharge Instructions   None    ED Prescriptions   None    PDMP not reviewed this encounter.   Valinda Hoar, [place] 01/01/22 1643

## 2022-01-01 NOTE — Discharge Instructions (Signed)
Today you are being treated for gout flareup  Beginning tomorrow morning take prednisone every day with food for 5 days, you may use Tylenol 500 to 1000 mg every 6 hours throughout the day for additional comfort  You may apply a heating pad or similar product to the affected area 15 minutes on 15 minutes off  Try to decrease your intake of beef, cheese, alcohol if you drink as these can worsen your symptoms, the following foods may also cause flareups Liver. Kidney. Anchovies. Asparagus. Herring. Mushrooms. Mussels. Beer.  You may follow-up with urgent care or with your primary care doctor for persistent or recurrent symptoms as needed

## 2022-01-01 NOTE — ED Triage Notes (Signed)
Pt reports gout flare up in right for a few days. Having swelling and pain in foot.

## 2022-09-08 ENCOUNTER — Ambulatory Visit (HOSPITAL_COMMUNITY)
Admission: EM | Admit: 2022-09-08 | Discharge: 2022-09-08 | Disposition: A | Discharge status: Home / Self Care | Payer: No Typology Code available for payment source | Attending: Emergency Medicine | Admitting: Emergency Medicine

## 2022-09-08 ENCOUNTER — Encounter (HOSPITAL_COMMUNITY): Payer: Self-pay | Admitting: Emergency Medicine

## 2022-09-08 DIAGNOSIS — H6121 Impacted cerumen, right ear: Secondary | ICD-10-CM | POA: Diagnosis not present

## 2022-09-08 DIAGNOSIS — M109 Gout, unspecified: Secondary | ICD-10-CM

## 2022-09-08 DIAGNOSIS — H6123 Impacted cerumen, bilateral: Secondary | ICD-10-CM | POA: Diagnosis not present

## 2022-09-08 DIAGNOSIS — H60501 Unspecified acute noninfective otitis externa, right ear: Secondary | ICD-10-CM

## 2022-09-08 MED ORDER — CIPROFLOXACIN-DEXAMETHASONE 0.3-0.1 % OT SUSP: 4.0000 [drp] | Freq: Two times a day (BID) | OTIC | 0 refills | Status: DC

## 2022-09-08 MED ORDER — PREDNISONE 20 MG PO TABS: 20.0000 mg | ORAL_TABLET | Freq: Every day | ORAL | 0 refills | Status: DC

## 2022-09-08 MED ORDER — CARBAMIDE PEROXIDE 6.5 % OT SOLN
OTIC | Status: AC
  Filled 2022-09-08: qty 15

## 2022-09-08 MED ORDER — CARBAMIDE PEROXIDE 6.5 % OT SOLN
5.0000 [drp] | Freq: Once | OTIC | Status: AC
  Administered 2022-09-08: 5 [drp] via OTIC

## 2022-09-08 NOTE — Discharge Instructions (Addendum)
We performed an ear irrigation to treat the ear impaction.  I've sent Ciprodex eardrops to the pharmacy, you will use 4 drops in the right ear 2 times daily for the next 5 days.  Prednisone has been sent to the pharmacy for the gout, you will take 1 tablet once a day for the next 6 days.  As discussed, I think it is best to take this medication in the morning due to side effects of the prednisone medication.  I have attached information on the back of your paperwork that can inform you on foods that can trigger gout flares

## 2022-09-08 NOTE — ED Triage Notes (Signed)
Pt c/o right ear pain for 2 weeks.  Two days having right toe pains and states hx gout.

## 2022-09-08 NOTE — ED Provider Notes (Signed)
[place]    CSN: [id number] Arrival date & time: 09/08/22  1048      History   Chief Complaint Chief Complaint  Patient presents with   Otalgia    HPI Scott Fischer is a 46 y.o. male.  Patient presents due to right ear pain x 2 weeks.  Patient denies muffled sounds.  Patient denies any other cold symptoms.  Patient denies any trauma to ear.  Patient has not taken any medications for symptoms. Patient reports history of ear infection, unknown as to when this occurred.  Patient reports right greater toe pain that started 2 days ago.  He denies any trauma or fall.  He denies numbness or tingling to right foot. patient reports pain is progressively worsening.  Patient endorses swelling to site.  Patient states pain is worse when touching the area.  He has a history of gout.  He has not taken any medications for symptoms.    Otalgia Associated symptoms: neck pain (Reports soreness to LFT side of neck, under ear.)   Associated symptoms: no congestion, no ear discharge, no fever, no hearing loss, no rhinorrhea, no sore throat and no tinnitus     Past Medical History:  Diagnosis Date   Depression    Difficulty controlling anger    Hypertension    PTSD (post-traumatic stress disorder)    Suicidal behavior     Patient Active Problem List   Diagnosis Date Noted   Alcohol dependence with alcohol-induced mood disorder ([place]) 11/08/2016    Past Surgical History:  Procedure Laterality Date   EYE SURGERY         Home Medications    Prior to Admission medications   Medication Sig Start Date End Date Taking? Authorizing Provider  ciprofloxacin-dexamethasone (CIPRODEX) OTIC suspension Place 4 drops into the right ear 2 (two) times daily. 4 drops 2 times daily for 5 days 09/08/22  Yes Flossie Dibble, NP  carbamazepine (TEGRETOL XR) 200 MG 12 hr tablet Take 1 tablet (200 mg total) by mouth 2 (two) times daily. 01/23/17   Patrecia Pour, NP  gabapentin  (NEURONTIN) 300 MG capsule Take 1 capsule (300 mg total) by mouth 3 (three) times daily. 01/23/17   Patrecia Pour, NP  hydrochlorothiazide (HYDRODIURIL) 25 MG tablet Take 1 tablet (25 mg total) by mouth daily. 02/01/17   Muthersbaugh, Jarrett Soho, PA-C  naproxen (NAPROSYN) 500 MG tablet Take 1 tablet (500 mg total) by mouth 2 (two) times daily. 08/09/19   Jaynee Eagles, PA-C  potassium chloride SA (K-DUR,KLOR-CON) 20 MEQ tablet Take 1 tablet (20 mEq total) by mouth daily. 02/01/17   Muthersbaugh, Jarrett Soho, PA-C  predniSONE (DELTASONE) 20 MG tablet Take 1 tablet (20 mg total) by mouth daily. 09/08/22  Yes Flossie Dibble, NP  ARIPiprazole (ABILIFY) 10 MG tablet Take 1 tablet (10 mg total) by mouth at bedtime. 01/23/17 08/09/19  Patrecia Pour, NP  traZODone (DESYREL) 50 MG tablet Take 1 tablet (50 mg total) by mouth at bedtime. 01/23/17 08/09/19  Patrecia Pour, NP    Family History Family History  Problem Relation Age of Onset   Diabetes Mother    Hypertension Mother    Hypertension Father     Social History Social History   Tobacco Use   Smoking status: Former   Smokeless tobacco: Never  Scientific laboratory technician Use: Never used  Substance Use Topics   Alcohol use: Yes    Comment: 6 pack daily   Drug  use: Yes    Types: Marijuana    Comment:       Allergies   Patient has no known allergies.   Review of Systems Review of Systems  Constitutional:  Negative for activity change and fever.  HENT:  Positive for ear pain (Right). Negative for congestion, ear discharge, hearing loss, postnasal drip, rhinorrhea, sinus pressure, sinus pain, sore throat, tinnitus and trouble swallowing.   Musculoskeletal:  Positive for neck pain (Reports soreness to LFT side of neck, under ear.).     Physical Exam Triage Vital Signs ED Triage Vitals  Enc Vitals Group     BP 09/08/22 1117 (!) 153/98     Pulse Rate 09/08/22 1117 98     Resp 09/08/22 1117 18     Temp 09/08/22 1117 98.7 F (37.1 C)     Temp  Source 09/08/22 1117 Oral     SpO2 09/08/22 1117 100 %     Weight --      Height --      Head Circumference --      Peak Flow --      Pain Score 09/08/22 1116 8     Pain Loc --      Pain Edu? --      Excl. in GC? --    No data found.  Updated Vital Signs BP (!) 153/98 (BP Location: Left Arm)   Pulse 98   Temp 98.7 F (37.1 C) (Oral)   Resp 18   SpO2 100%        Physical Exam Vitals and nursing note reviewed.  HENT:     Head: Normocephalic.     Right Ear: Hearing, tympanic membrane and external ear normal. Swelling (Swelling and erythema  of RT ear canal noted after irrigation) present. There is impacted cerumen.     Left Ear: Hearing, tympanic membrane and external ear normal. There is impacted cerumen.     Ears:     Comments: Bilateral ear impaction upon initial examination of ear.   TM normal after ear irrigation.  Neck:   Cardiovascular:     Pulses:          Dorsalis pedis pulses are 2+ on the right side.  Musculoskeletal:     Cervical back: Normal range of motion.     Right lower leg: No edema.     Right foot: Normal range of motion.  Feet:     Right foot:     Skin integrity: Erythema, warmth and dry skin present. No blister or skin breakdown.     Toenail Condition: Right toenails are normal.     Comments: Warmth and erythema present to RT greater toe. Tenderness upon palpation of RT greater toe.  Normal ROM of foot.  Lymphadenopathy:     Cervical: No cervical adenopathy.      UC Treatments / Results  Labs (all labs ordered are listed, but only abnormal results are displayed) Labs Reviewed - No data to display  EKG   Radiology No results found.  Procedures Ear Cerumen Removal  Date/Time: 09/08/2022 1:23 PM  Performed by: Debby Freiberg, NP Authorized by: Debby Freiberg, NP   Consent:    Consent obtained:  Verbal   Consent given by:  Patient   Risks, benefits, and alternatives were discussed: yes     Risks discussed:  Bleeding,  infection, incomplete removal and dizziness Procedure details:    Location:  R ear   Procedure type: irrigation  Procedure outcomes: cerumen removed   Post-procedure details:    Inspection:  TM intact (Erythematous ear canal and swelling)   Hearing quality:  Improved   Procedure completion:  Tolerated well, no immediate complications  (including critical care time)  Medications Ordered in UC Medications  carbamide peroxide (DEBROX) 6.5 % OTIC (EAR) solution 5 drop (5 drops Right EAR Given 09/08/22 1228)    Initial Impression / Assessment and Plan / UC Course  I have reviewed the triage vital signs and the nursing notes.  Pertinent labs & imaging results that were available during my care of the patient were reviewed by me and considered in my medical decision making (see chart for details).     Patient was treated for a gout flare of the right greater toe and bilateral ear impaction.   We performed ear irrigation in office today.  Ear irrigation was successful with the left ear initially but the right ear was not successful initially.  Debrox drops were placed in right ear to help soften wax.  Ear irrigation of right ear was successful after multiple attempts by this Clinical research associate and RN.  Patient states he had improvement in his hearing in right ear and pressure improvement after successful irrigation.  Right ear canal was erythematous and swollen upon assessment after irrigation.  Ciprodex eardrops sent to pharmacy for otitis externa.  Patient was instructed on how to use this medication.  Patient verbalized understanding of instructions  Patient was given prednisone for gout flare.  Patient was educated on possible side effects of steroid medication.  Patient was given information on things that can trigger gout flares.  She verbalized understanding of instructions Final Clinical Impressions(s) / UC Diagnoses   Final diagnoses:  Gout involving toe of right foot, unspecified cause,  unspecified chronicity  Bilateral impacted cerumen  Acute otitis externa of right ear, unspecified type     Discharge Instructions      We performed an ear irrigation to treat the ear impaction.  I've sent Ciprodex eardrops to the pharmacy, you will use 4 drops in the right ear 2 times daily for the next 5 days.  Prednisone has been sent to the pharmacy for the gout, you will take 1 tablet once a day for the next 6 days.  As discussed, I think it is best to take this medication in the morning due to side effects of the prednisone medication.  I have attached information on the back of your paperwork that can inform you on foods that can trigger gout flares     ED Prescriptions     Medication Sig Dispense Auth. Provider   predniSONE (DELTASONE) 20 MG tablet Take 1 tablet (20 mg total) by mouth daily. 6 tablet Debby Freiberg, NP   ciprofloxacin-dexamethasone (CIPRODEX) OTIC suspension Place 4 drops into the right ear 2 (two) times daily. 4 drops 2 times daily for 5 days 7.5 mL Debby Freiberg, NP      PDMP not reviewed this encounter.   Debby Freiberg, NP 09/08/22 1325

## 2022-11-18 ENCOUNTER — Encounter (HOSPITAL_COMMUNITY): Payer: Self-pay | Admitting: Emergency Medicine

## 2022-11-18 ENCOUNTER — Ambulatory Visit (HOSPITAL_COMMUNITY)
Admission: EM | Admit: 2022-11-18 | Discharge: 2022-11-18 | Disposition: A | Discharge status: Home / Self Care | Payer: No Typology Code available for payment source | Attending: Internal Medicine | Admitting: Internal Medicine

## 2022-11-18 DIAGNOSIS — F10239 Alcohol dependence with withdrawal, unspecified: Secondary | ICD-10-CM

## 2022-11-18 DIAGNOSIS — R0602 Shortness of breath: Secondary | ICD-10-CM

## 2022-11-18 DIAGNOSIS — F19939 Other psychoactive substance use, unspecified with withdrawal, unspecified: Secondary | ICD-10-CM

## 2022-11-18 DIAGNOSIS — U071 COVID-19: Secondary | ICD-10-CM | POA: Diagnosis not present

## 2022-11-18 DIAGNOSIS — R0989 Other specified symptoms and signs involving the circulatory and respiratory systems: Secondary | ICD-10-CM

## 2022-11-18 MED ORDER — ALBUTEROL SULFATE HFA 108 (90 BASE) MCG/ACT IN AERS
2.0000 | INHALATION_SPRAY | Freq: Once | RESPIRATORY_TRACT | Status: AC
  Administered 2022-11-18: 2 via RESPIRATORY_TRACT

## 2022-11-18 MED ORDER — ALBUTEROL SULFATE HFA 108 (90 BASE) MCG/ACT IN AERS
INHALATION_SPRAY | RESPIRATORY_TRACT | Status: AC
  Filled 2022-11-18: qty 6.7

## 2022-11-18 MED ORDER — MOLNUPIRAVIR EUA 200MG CAPSULE: 4.0000 | ORAL_CAPSULE | Freq: Two times a day (BID) | ORAL | 0 refills | Status: AC

## 2022-11-18 MED ORDER — DEXAMETHASONE SODIUM PHOSPHATE 10 MG/ML IJ SOLN
10.0000 mg | Freq: Once | INTRAMUSCULAR | Status: AC
  Administered 2022-11-18: 10 mg via INTRAMUSCULAR

## 2022-11-18 MED ORDER — BENZONATATE 100 MG PO CAPS: 100.0000 mg | ORAL_CAPSULE | Freq: Three times a day (TID) | ORAL | 0 refills | Status: DC

## 2022-11-18 MED ORDER — ALBUTEROL SULFATE HFA 108 (90 BASE) MCG/ACT IN AERS: 1.0000 | INHALATION_SPRAY | Freq: Four times a day (QID) | RESPIRATORY_TRACT | 0 refills | Status: AC | PRN

## 2022-11-18 MED ORDER — DEXAMETHASONE SODIUM PHOSPHATE 10 MG/ML IJ SOLN
INTRAMUSCULAR | Status: AC
  Filled 2022-11-18: qty 1

## 2022-11-18 MED ORDER — HYDROXYZINE HCL 25 MG PO TABS: 25.0000 mg | ORAL_TABLET | Freq: Three times a day (TID) | ORAL | 0 refills | Status: AC | PRN

## 2022-11-18 NOTE — ED Provider Notes (Signed)
[facility]    CSN: [id number] Arrival date & time: 11/18/22  1622      History   Chief Complaint Chief Complaint  Patient presents with   Covid Positive    HPI Scott Fischer is a 46 y.o. male.   Patient presents urgent care for evaluation of cough, shortness of breath, wheezing, and generalized fatigue that started approximately 5 days ago.  He took a COVID-19 test at home and this was positive 3 days ago.  He began to hear wheezing at night when he was coughing, this caused him to seek evaluation in urgent care.  Tremor noticed on exam while interviewing patient.  He states he has had approximately 3-6 12 ounce beers per day for the last several years but has not had any alcohol today.  States that he usually gets a tremor if he does not drink alcohol for a short period of time.  He smokes cigarettes and marijuana daily, denies other drug use.  Denies auditory and visual hallucinations, confusion, seizure-like activity, and dizziness.  No abdominal pain, nausea, vomiting, ear pain, and blurred vision. Currently experiencing productive cough, shortness of breath, and posttussive chest discomfort.  Chest discomfort resolves after coughing stops.  He denies heart palpitations, fever/chills, and history of chronic respiratory problems.  He has never been hospitalized related to COVID-19 in the past.  Has been using over-the-counter medicines to try to help with symptoms without relief.     Past Medical History:  Diagnosis Date   Depression    Difficulty controlling anger    Hypertension    PTSD (post-traumatic stress disorder)    Suicidal behavior     Patient Active Problem List   Diagnosis Date Noted   Alcohol dependence with alcohol-induced mood disorder ([facility]) 11/08/2016    Past Surgical History:  Procedure Laterality Date   EYE SURGERY         Home Medications    Prior to Admission medications   Medication Sig Start Date End Date Taking? Authorizing  Provider  albuterol (VENTOLIN HFA) 108 (90 Base) MCG/ACT inhaler Inhale 1-2 puffs into the lungs every 6 (six) hours as needed for wheezing or shortness of breath. 11/18/22  Yes Carlisle Beers, FNP  benzonatate (TESSALON) 100 MG capsule Take 1 capsule (100 mg total) by mouth every 8 (eight) hours. 11/18/22  Yes Carlisle Beers, FNP  hydrOXYzine (ATARAX) 25 MG tablet Take 1 tablet (25 mg total) by mouth every 8 (eight) hours as needed. 11/18/22  Yes Trinidee Schrag, Donavan Burnet, FNP  molnupiravir EUA (LAGEVRIO) 200 mg CAPS capsule Take 4 capsules (800 mg total) by mouth 2 (two) times daily for 5 days. 11/18/22 11/23/22 Yes StanhopeDonavan Burnet, FNP  carbamazepine (TEGRETOL XR) 200 MG 12 hr tablet Take 1 tablet (200 mg total) by mouth 2 (two) times daily. 01/23/17   Charm Rings, NP  ciprofloxacin-dexamethasone (CIPRODEX) OTIC suspension Place 4 drops into the right ear 2 (two) times daily. 4 drops 2 times daily for 5 days 09/08/22   Debby Freiberg, NP  gabapentin (NEURONTIN) 300 MG capsule Take 1 capsule (300 mg total) by mouth 3 (three) times daily. 01/23/17   Charm Rings, NP  hydrochlorothiazide (HYDRODIURIL) 25 MG tablet Take 1 tablet (25 mg total) by mouth daily. 02/01/17   Muthersbaugh, Dahlia Client, PA-C  naproxen (NAPROSYN) 500 MG tablet Take 1 tablet (500 mg total) by mouth 2 (two) times daily. 08/09/19   Wallis Bamberg, PA-C  potassium chloride SA (K-DUR,KLOR-CON) 20  MEQ tablet Take 1 tablet (20 mEq total) by mouth daily. 02/01/17   Muthersbaugh, Dahlia Client, PA-C  predniSONE (DELTASONE) 20 MG tablet Take 1 tablet (20 mg total) by mouth daily. 09/08/22   Debby Freiberg, NP  ARIPiprazole (ABILIFY) 10 MG tablet Take 1 tablet (10 mg total) by mouth at bedtime. 01/23/17 08/09/19  Charm Rings, NP  traZODone (DESYREL) 50 MG tablet Take 1 tablet (50 mg total) by mouth at bedtime. 01/23/17 08/09/19  Charm Rings, NP    Family History Family History  Problem Relation Age of Onset   Diabetes  Mother    Hypertension Mother    Hypertension Father     Social History Social History   Tobacco Use   Smoking status: Former   Smokeless tobacco: Never  Building services engineer Use: Never used  Substance Use Topics   Alcohol use: Yes    Comment: 6 pack daily   Drug use: Yes    Types: Marijuana    Comment:       Allergies   Patient has no known allergies.   Review of Systems Review of Systems Per HPI  Physical Exam Triage Vital Signs ED Triage Vitals  Enc Vitals Group     BP 11/18/22 1806 (!) 151/105     Pulse Rate 11/18/22 1806 88     Resp 11/18/22 1806 17     Temp 11/18/22 1806 98.3 F (36.8 C)     Temp Source 11/18/22 1806 Oral     SpO2 11/18/22 1806 98 %     Weight --      Height --      Head Circumference --      Peak Flow --      Pain Score 11/18/22 1805 8     Pain Loc --      Pain Edu? --      Excl. in GC? --    No data found.  Updated Vital Signs BP (!) 151/105 (BP Location: Right Arm)   Pulse 88   Temp 98.3 F (36.8 C) (Oral)   Resp 17   SpO2 98%   Visual Acuity Right Eye Distance:   Left Eye Distance:   Bilateral Distance:    Right Eye Near:   Left Eye Near:    Bilateral Near:     Physical Exam Vitals and nursing note reviewed.  Constitutional:      Appearance: He is ill-appearing. He is not toxic-appearing.  HENT:     Head: Normocephalic and atraumatic.     Right Ear: Hearing, tympanic membrane, ear canal and external ear normal.     Left Ear: Hearing, tympanic membrane, ear canal and external ear normal.     Nose: Congestion present.     Mouth/Throat:     Lips: Pink.     Mouth: Mucous membranes are moist.     Pharynx: Posterior oropharyngeal erythema present.  Eyes:     General: Lids are normal. Vision grossly intact. Gaze aligned appropriately.     Extraocular Movements: Extraocular movements intact.     Conjunctiva/sclera: Conjunctivae normal.     Comments: EOMs intact without pain or dizziness elicited.  Cardiovascular:      Rate and Rhythm: Normal rate and regular rhythm.     Heart sounds: Normal heart sounds.  Pulmonary:     Effort: Pulmonary effort is normal. No respiratory distress.     Breath sounds: Rhonchi present. No wheezing or rales.     Comments: Diffuse  rhonchi heard to all lung fields on inspiration and expiration.  Rhonchi slightly clear with coughing but not completely.  No prolonged expiration, retractions, or accessory muscle use.  No respiratory distress. Abdominal:     General: Abdomen is flat. Bowel sounds are normal.     Palpations: Abdomen is soft.  Musculoskeletal:     Cervical back: Neck supple.  Lymphadenopathy:     Cervical: Cervical adenopathy present.  Skin:    General: Skin is warm and dry.     Capillary Refill: Capillary refill takes less than 2 seconds.     Findings: No rash.  Neurological:     General: No focal deficit present.     Mental Status: He is alert and oriented to person, place, and time. Mental status is at baseline.     Cranial Nerves: No dysarthria or facial asymmetry.     Motor: No weakness.     Gait: Gait normal.     Comments: Involuntary generalized tremor noted to physical exam, especially to the bilateral hands.  Sensation and strength intact to all 4 extremities.  Moves all 4 extremities with normal coordination voluntarily.  Nonfocal neuroexam.  Patient is mentating to his baseline and is cognitively intact.  Psychiatric:        Mood and Affect: Mood normal.        Speech: Speech normal.        Behavior: Behavior normal.        Thought Content: Thought content normal.        Judgment: Judgment normal.      UC Treatments / Results  Labs (all labs ordered are listed, but only abnormal results are displayed) Labs Reviewed - No data to display  EKG   Radiology No results found.  Procedures Procedures (including critical care time)  Medications Ordered in UC Medications  dexamethasone (DECADRON) injection 10 mg (has no administration in  time range)  albuterol (VENTOLIN HFA) 108 (90 Base) MCG/ACT inhaler 2 puff (2 puffs Inhalation Given 11/18/22 1847)    Initial Impression / Assessment and Plan / UC Course  I have reviewed the triage vital signs and the nursing notes.  Pertinent labs & imaging results that were available during my care of the patient were reviewed by me and considered in my medical decision making (see chart for details).   1.  COVID-19, alcohol dependence with throat withdrawal complication, tremor due to substance withdrawal, rhonchi, shortness of breath COVID-19 testing at home is positive.  Patient is not a candidate for Paxlovid due to drug drug interaction with carbo mepazine.  We will send in molnupiravir for him to start tonight and take for the next 5 days to reduce risk of severe symptoms and hospitalization.  All other symptoms will be managed with prescriptions for symptomatic relief.  Patient may take albuterol every 4-6 hours as needed for cough, shortness of breath, and wheeze.  Dexamethasone 10 mg injection IM given in clinic to help with shortness of breath and inflammation to the lungs.  Deferred imaging based on stable cardiopulmonary exam and hemodynamically stable vital signs.  He is nontoxic in appearance and appears to be well-hydrated.  He may take Tessalon Perles every 8 hours as needed for cough.  May purchase Mucinex over-the-counter and take this to help break up mucus.  Strict ER return precautions discussed should symptoms fail to improve or worsen in the next 48 to 72 hours.  May take hydroxyzine every 8 hours as needed for alcohol withdrawal symptoms.  He appears to be neurologically intact and stable at this time.  Strict ER return precautions discussed regarding alcohol withdrawal symptoms.  Patient voices understanding and agreement with plan.  Discussed alcohol cessation, patient is not ready to receive treatment to withdraw completely from alcohol.  He has been advised to seek help  with this as alcohol withdrawal can be life-threatening.  He is not exhibiting any signs of delirium, confusion, or need for emergency evaluation at this time.  Discussed physical exam and available lab work findings in clinic with patient.  Counseled patient regarding appropriate use of medications and potential side effects for all medications recommended or prescribed today. Discussed red flag signs and symptoms of worsening condition,when to call the PCP office, return to urgent care, and when to seek higher level of care in the emergency department. Patient verbalizes understanding and agreement with plan. All questions answered. Patient discharged in stable condition.    Final Clinical Impressions(s) / UC Diagnoses   Final diagnoses:  COVID-19  Tremor due to substance withdrawal ([facility])  Alcohol dependence with withdrawal with complication ([facility])  Rhonchi  Shortness of breath     Discharge Instructions      You have COVID-19 and viral illness.  Take molnupiravir as prescribed.  I have given you a shot of steroid today to help with your breathing. You may use albuterol inhaler every 4-6 hours as needed for cough, shortness of breath, and wheezing. You may take Tessalon Perles every 8 hours as needed for cough.  You may take hydroxyzine 25 mg every 8 hours as needed for alcohol withdrawal symptoms including tremor. If you begin to experience confusion, hearing or seeing things that are not physically here, or worsening tremor, I would like for you to go to the emergency room for further evaluation.  Purchase Mucinex over-the-counter and take this every 12 hours to break up your mucus so that you are able to cough it up and blow it out of your nose easier.  Your work note was at the end of the packet.  Stay at home and wear a mask until day 6 of symptoms. After day 6 of symptoms (tomorrow) you may go out into public but you must wear a mask around other people so that you do not  infect them until day 11.  If you develop any new or worsening symptoms or do not improve in the next 2 to 3 days, please return.  If your symptoms are severe, please go to the emergency room.  Follow-up with your primary care provider for further evaluation and management of your symptoms as well as ongoing wellness visits.  I hope you feel better!   ED Prescriptions     Medication Sig Dispense Auth. Provider   hydrOXYzine (ATARAX) 25 MG tablet Take 1 tablet (25 mg total) by mouth every 8 (eight) hours as needed. 20 tablet Carlisle BeersStanhope, Iden Stripling M, FNP   albuterol (VENTOLIN HFA) 108 (90 Base) MCG/ACT inhaler Inhale 1-2 puffs into the lungs every 6 (six) hours as needed for wheezing or shortness of breath. 8 g Reita MayStanhope, Dequavion Follette M, FNP   benzonatate (TESSALON) 100 MG capsule Take 1 capsule (100 mg total) by mouth every 8 (eight) hours. 21 capsule Reita MayStanhope, Viyaan Champine M, FNP   molnupiravir EUA (LAGEVRIO) 200 mg CAPS capsule Take 4 capsules (800 mg total) by mouth 2 (two) times daily for 5 days. 40 capsule Carlisle BeersStanhope, Natarsha Hurwitz M, FNP      PDMP not reviewed this encounter.   Roby Spalla,  Donavan Burnet, FNP 11/18/22 1859

## 2022-11-18 NOTE — Discharge Instructions (Addendum)
You have COVID-19 and viral illness.  Take molnupiravir as prescribed.  I have given you a shot of steroid today to help with your breathing. You may use albuterol inhaler every 4-6 hours as needed for cough, shortness of breath, and wheezing. You may take Tessalon Perles every 8 hours as needed for cough.  You may take hydroxyzine 25 mg every 8 hours as needed for alcohol withdrawal symptoms including tremor. If you begin to experience confusion, hearing or seeing things that are not physically here, or worsening tremor, I would like for you to go to the emergency room for further evaluation.  Purchase Mucinex over-the-counter and take this every 12 hours to break up your mucus so that you are able to cough it up and blow it out of your nose easier.  Your work note was at the end of the packet.  Stay at home and wear a mask until day 6 of symptoms. After day 6 of symptoms (tomorrow) you may go out into public but you must wear a mask around other people so that you do not infect them until day 11.  If you develop any new or worsening symptoms or do not improve in the next 2 to 3 days, please return.  If your symptoms are severe, please go to the emergency room.  Follow-up with your primary care provider for further evaluation and management of your symptoms as well as ongoing wellness visits.  I hope you feel better!

## 2022-11-18 NOTE — ED Triage Notes (Signed)
Pt reports had home covid test that was positive 3 days ago. Reports chest hurts when coughing or taking deep breath. Reports had some productive cough.

## 2023-06-06 ENCOUNTER — Ambulatory Visit (HOSPITAL_COMMUNITY)
Admission: EM | Admit: 2023-06-06 | Discharge: 2023-06-06 | Disposition: A | Discharge status: Home / Self Care | Payer: No Typology Code available for payment source | Attending: Family Medicine | Admitting: Family Medicine

## 2023-06-06 ENCOUNTER — Encounter (HOSPITAL_COMMUNITY): Payer: Self-pay | Admitting: Emergency Medicine

## 2023-06-06 DIAGNOSIS — M545 Low back pain, unspecified: Secondary | ICD-10-CM | POA: Diagnosis not present

## 2023-06-06 MED ORDER — CYCLOBENZAPRINE HCL 10 MG PO TABS: 10.0000 mg | ORAL_TABLET | Freq: Three times a day (TID) | ORAL | 0 refills | Status: DC

## 2023-06-06 MED ORDER — KETOROLAC TROMETHAMINE 60 MG/2ML IM SOLN
INTRAMUSCULAR | Status: AC
  Filled 2023-06-06: qty 2

## 2023-06-06 MED ORDER — KETOROLAC TROMETHAMINE 30 MG/ML IJ SOLN
60.0000 mg | Freq: Once | INTRAMUSCULAR | Status: AC
  Administered 2023-06-06: 60 mg via INTRAMUSCULAR

## 2023-06-06 MED ORDER — IBUPROFEN 800 MG PO TABS: 800.0000 mg | ORAL_TABLET | Freq: Three times a day (TID) | ORAL | 0 refills | Status: AC

## 2023-06-06 MED ORDER — LIDOCAINE 5 % EX PTCH: 1.0000 | MEDICATED_PATCH | CUTANEOUS | 0 refills | Status: DC

## 2023-06-06 NOTE — Discharge Instructions (Addendum)
The Toradol injection given today should start to work in about 30 minutes.  You can take the muscle relaxer Flexeril three times daily. If the medication makes you drowsy, take only one at bed time.  Starting tomorrow morning you can use ibuprofen every 6 hours for pain. You can use tylenol every 4-6 hours.  Lidocaine patch can be applied for 12 hours at a time  Avoid heavy lifting and strenuous activity It may take several days to a week for symptoms to improve.  [facility]. Please go to the emergency department if symptoms worsen or become severe.

## 2023-06-06 NOTE — ED Triage Notes (Signed)
Pt c/o lower back pains that had but gotten worse over past couple weeks. Denies fall, injury, lifting. Tried icy hot without relief.

## 2023-06-06 NOTE — ED Provider Notes (Signed)
[facility]    CSN: [id number] Arrival date & time: 06/06/23  1401      History   Chief Complaint Chief Complaint  Patient presents with   Back Pain    HPI Scott Fischer is a 47 y.o. male.  Here with back pain for several weeks but worsened over last week. Rates pain as 9/10. Located across low back, does not radiate. Tried icyhot, no other interventions No injury, trauma, fall, heavy lifting. Denies extremity weakness, numbness or tingling, bladder or bowel dysfunction  He sees the [place] tomorrow   History of back pain on and off for several years   Past Medical History:  Diagnosis Date   Depression    Difficulty controlling anger    Hypertension    PTSD (post-traumatic stress disorder)    Suicidal behavior     Patient Active Problem List   Diagnosis Date Noted   Alcohol dependence with alcohol-induced mood disorder (HCC) 11/08/2016    Past Surgical History:  Procedure Laterality Date   EYE SURGERY     Home Medications    Prior to Admission medications   Medication Sig Start Date End Date Taking? Authorizing Provider  cyclobenzaprine (FLEXERIL) 10 MG tablet Take 1 tablet (10 mg total) by mouth 3 (three) times daily. 06/06/23  Yes Awilda Covin, Lurena Joiner, PA-C  ibuprofen (ADVIL) 800 MG tablet Take 1 tablet (800 mg total) by mouth 3 (three) times daily. 06/06/23  Yes Garrett Mitchum, PA-C  lidocaine (LIDODERM) 5 % Place 1 patch onto the skin daily. Remove & Discard patch within 12 hours 06/06/23  Yes Shamari Lofquist, Lurena Joiner, PA-C  albuterol (VENTOLIN HFA) 108 (90 Base) MCG/ACT inhaler Inhale 1-2 puffs into the lungs every 6 (six) hours as needed for wheezing or shortness of breath. 11/18/22   Carlisle Beers, FNP  carbamazepine (TEGRETOL XR) 200 MG 12 hr tablet Take 1 tablet (200 mg total) by mouth 2 (two) times daily. 01/23/17   Charm Rings, NP  gabapentin (NEURONTIN) 300 MG capsule Take 1 capsule (300 mg total) by mouth 3 (three) times daily. 01/23/17   Charm Rings, NP  hydrochlorothiazide (HYDRODIURIL) 25 MG tablet Take 1 tablet (25 mg total) by mouth daily. 02/01/17   Muthersbaugh, Dahlia Client, PA-C  hydrOXYzine (ATARAX) 25 MG tablet Take 1 tablet (25 mg total) by mouth every 8 (eight) hours as needed. 11/18/22   Carlisle Beers, FNP  potassium chloride SA (K-DUR,KLOR-CON) 20 MEQ tablet Take 1 tablet (20 mEq total) by mouth daily. 02/01/17   Muthersbaugh, Dahlia Client, PA-C  ARIPiprazole (ABILIFY) 10 MG tablet Take 1 tablet (10 mg total) by mouth at bedtime. 01/23/17 08/09/19  Charm Rings, NP  traZODone (DESYREL) 50 MG tablet Take 1 tablet (50 mg total) by mouth at bedtime. 01/23/17 08/09/19  Charm Rings, NP    Family History Family History  Problem Relation Age of Onset   Diabetes Mother    Hypertension Mother    Hypertension Father     Social History Social History   Tobacco Use   Smoking status: Former   Smokeless tobacco: Never  Building services engineer Use: Never used  Substance Use Topics   Alcohol use: Yes    Comment: 6 pack daily   Drug use: Yes    Types: Marijuana    Comment:       Allergies   Patient has no known allergies.   Review of Systems Review of Systems  Musculoskeletal:  Positive for back pain.  As per HPI  Physical Exam Triage Vital Signs ED Triage Vitals  Enc Vitals Group     BP 06/06/23 1556 (!) 142/96     Pulse Rate 06/06/23 1556 100     Resp 06/06/23 1556 18     Temp 06/06/23 1556 98.1 F (36.7 C)     Temp Source 06/06/23 1556 Oral     SpO2 06/06/23 1556 97 %     Weight --      Height --      Head Circumference --      Peak Flow --      Pain Score 06/06/23 1554 9     Pain Loc --      Pain Edu? --      Excl. in GC? --    No data found.  Updated Vital Signs BP (!) 142/96 (BP Location: Left Arm)   Pulse 100   Temp 98.1 F (36.7 C) (Oral)   Resp 18   SpO2 97%    Physical Exam Vitals and nursing note reviewed.  Constitutional:      General: He is not in acute distress. HENT:      Mouth/Throat:     Mouth: Mucous membranes are moist.     Pharynx: Oropharynx is clear.  Eyes:     Extraocular Movements: Extraocular movements intact.     Conjunctiva/sclera: Conjunctivae normal.     Pupils: Pupils are equal, round, and reactive to light.  Cardiovascular:     Rate and Rhythm: Normal rate and regular rhythm.     Heart sounds: Normal heart sounds.  Pulmonary:     Effort: Pulmonary effort is normal.     Breath sounds: Normal breath sounds.  Musculoskeletal:        General: Normal range of motion.     Cervical back: Normal range of motion. No rigidity or tenderness.     Lumbar back: Spasms and tenderness present. No swelling, signs of trauma or bony tenderness.     Comments: No bony tenderness C-L spine Tenderness, tightness and spasm lumbar paraspinals.  Full ROM of neck and extremities  Skin:    General: Skin is warm and dry.     Findings: No bruising or erythema.  Neurological:     General: No focal deficit present.     Mental Status: He is alert and oriented to person, place, and time.     Cranial Nerves: Cranial nerves 2-12 are intact. No cranial nerve deficit.     Sensory: Sensation is intact.     Motor: Motor function is intact. No weakness.     Coordination: Coordination is intact.     Gait: Gait is intact.     Deep Tendon Reflexes: Reflexes are normal and symmetric.     Comments: Strength 5/5. Sensation intact throughout      UC Treatments / Results  Labs (all labs ordered are listed, but only abnormal results are displayed) Labs Reviewed - No data to display  EKG  Radiology No results found.  Procedures Procedures  Medications Ordered in UC Medications  ketorolac (TORADOL) 30 MG/ML injection 60 mg (has no administration in time range)    Initial Impression / Assessment and Plan / UC Course  I have reviewed the triage vital signs and the nursing notes.  Pertinent labs & imaging results that were available during my care of the patient  were reviewed by me and considered in my medical decision making (see chart for details).  Afebrile, stable vitals. No  red flags. No bony tenderness, defer imaging at this time. Lab work 2 weeks ago. Good kidney function Toradol IM given in clinic Flexeril TID prn, lidocaine patch, alternate ibu and tylenol starting tomorrow. [facility]. ED precautions for worsening symptoms. Patient agreeable to plan, no questions at this time  Final Clinical Impressions(s) / UC Diagnoses   Final diagnoses:  Acute bilateral low back pain without sciatica     Discharge Instructions      The Toradol injection given today should start to work in about 30 minutes.  You can take the muscle relaxer Flexeril three times daily. If the medication makes you drowsy, take only one at bed time.  Starting tomorrow morning you can use ibuprofen every 6 hours for pain. You can use tylenol every 4-6 hours.  Lidocaine patch can be applied for 12 hours at a time  Avoid heavy lifting and strenuous activity It may take several days to a week for symptoms to improve.  [facility] persisting. Please go to the emergency department if symptoms worsen or become severe.      ED Prescriptions     Medication Sig Dispense Auth. Provider   ibuprofen (ADVIL) 800 MG tablet Take 1 tablet (800 mg total) by mouth 3 (three) times daily. 21 tablet Kyllian Clingerman, PA-C   lidocaine (LIDODERM) 5 % Place 1 patch onto the skin daily. Remove & Discard patch within 12 hours 14 patch Keyanni Whittinghill, PA-C   cyclobenzaprine (FLEXERIL) 10 MG tablet Take 1 tablet (10 mg total) by mouth 3 (three) times daily. 20 tablet Cornelio Parkerson, Lurena Joiner, PA-C      PDMP not reviewed this encounter.   Marlow Baars, [place] 06/06/23 1650

## 2024-01-12 ENCOUNTER — Ambulatory Visit (HOSPITAL_COMMUNITY)
Admission: EM | Admit: 2024-01-12 | Discharge: 2024-01-12 | Disposition: A | Discharge status: Home / Self Care | Payer: No Typology Code available for payment source | Attending: Emergency Medicine | Admitting: Emergency Medicine

## 2024-01-12 ENCOUNTER — Encounter (HOSPITAL_COMMUNITY): Payer: Self-pay

## 2024-01-12 DIAGNOSIS — M545 Low back pain, unspecified: Secondary | ICD-10-CM

## 2024-01-12 DIAGNOSIS — G8929 Other chronic pain: Secondary | ICD-10-CM | POA: Diagnosis not present

## 2024-01-12 MED ORDER — KETOROLAC TROMETHAMINE 30 MG/ML IJ SOLN
30.0000 mg | Freq: Once | INTRAMUSCULAR | Status: AC
  Administered 2024-01-12: 30 mg via INTRAMUSCULAR

## 2024-01-12 MED ORDER — KETOROLAC TROMETHAMINE 30 MG/ML IJ SOLN
INTRAMUSCULAR | Status: AC
  Filled 2024-01-12: qty 1

## 2024-01-12 MED ORDER — LIDOCAINE 5 % EX PTCH: 1.0000 | MEDICATED_PATCH | CUTANEOUS | 0 refills | Status: AC

## 2024-01-12 MED ORDER — METHOCARBAMOL 500 MG PO TABS: 500.0000 mg | ORAL_TABLET | Freq: Two times a day (BID) | ORAL | 0 refills | Status: AC

## 2024-01-12 NOTE — ED Provider Notes (Signed)
[facility]    CSN: [id number] Arrival date & time: 01/12/24  1108      History   Chief Complaint No chief complaint on file.   HPI Scott Fischer is a 48 y.o. male.   Patient presents with lower back pain x 3 days.  Patient reports history of low back pain.  Denies pain, numbness, tingling radiating down legs.  Denies any known injury.     Past Medical History:  Diagnosis Date   Depression    Difficulty controlling anger    Hypertension    PTSD (post-traumatic stress disorder)    Suicidal behavior     Patient Active Problem List   Diagnosis Date Noted   Alcohol dependence with alcohol-induced mood disorder (HCC) 11/08/2016    Past Surgical History:  Procedure Laterality Date   EYE SURGERY         Home Medications    Prior to Admission medications   Medication Sig Start Date End Date Taking? Authorizing Provider  gabapentin (NEURONTIN) 300 MG capsule Take 1 capsule (300 mg total) by mouth 3 (three) times daily. 01/23/17  Yes Charm Rings, NP  hydrochlorothiazide (HYDRODIURIL) 25 MG tablet Take 1 tablet (25 mg total) by mouth daily. 02/01/17  Yes Muthersbaugh, Dahlia Client, PA-C  hydrOXYzine (ATARAX) 25 MG tablet Take 1 tablet (25 mg total) by mouth every 8 (eight) hours as needed. 11/18/22  Yes Carlisle Beers, FNP  ibuprofen (ADVIL) 800 MG tablet Take 1 tablet (800 mg total) by mouth 3 (three) times daily. 06/06/23  Yes Rising, Rebecca, PA-C  lidocaine (LIDODERM) 5 % Place 1 patch onto the skin daily. Remove & Discard patch within 12 hours or as directed by MD 01/12/24  Yes Wynonia Lawman A, NP  methocarbamol (ROBAXIN) 500 MG tablet Take 1 tablet (500 mg total) by mouth 2 (two) times daily. 01/12/24  Yes Susann Givens, Davie Claud A, NP  albuterol (VENTOLIN HFA) 108 (90 Base) MCG/ACT inhaler Inhale 1-2 puffs into the lungs every 6 (six) hours as needed for wheezing or shortness of breath. 11/18/22   Carlisle Beers, FNP  ARIPiprazole (ABILIFY) 10 MG  tablet Take 1 tablet (10 mg total) by mouth at bedtime. 01/23/17 08/09/19  Charm Rings, NP  traZODone (DESYREL) 50 MG tablet Take 1 tablet (50 mg total) by mouth at bedtime. 01/23/17 08/09/19  Charm Rings, NP    Family History Family History  Problem Relation Age of Onset   Diabetes Mother    Hypertension Mother    Hypertension Father     Social History Social History   Tobacco Use   Smoking status: Former   Smokeless tobacco: Never  Advertising account planner   Vaping status: Never Used  Substance Use Topics   Alcohol use: Yes    Comment: 6 pack daily   Drug use: Yes    Types: Marijuana    Comment:       Allergies   Patient has no known allergies.   Review of Systems Review of Systems  Per HPI  Physical Exam Triage Vital Signs ED Triage Vitals [01/12/24 1155]  Encounter Vitals Group     BP 121/76     Systolic BP Percentile      Diastolic BP Percentile      Pulse Rate 100     Resp 18     Temp 98.4 F (36.9 C)     Temp Source Oral     SpO2 96 %     Weight  Height      Head Circumference      Peak Flow      Pain Score      Pain Loc      Pain Education      Exclude from Growth Chart    No data found.  Updated Vital Signs BP 121/76 (BP Location: Left Arm)   Pulse 100   Temp 98.4 F (36.9 C) (Oral)   Resp 18   SpO2 96%   Visual Acuity Right Eye Distance:   Left Eye Distance:   Bilateral Distance:    Right Eye Near:   Left Eye Near:    Bilateral Near:     Physical Exam Vitals and nursing note reviewed.  Constitutional:      General: He is awake. He is not in acute distress.    Appearance: Normal appearance. He is well-developed and well-groomed. He is not ill-appearing.  Musculoskeletal:     Cervical back: Normal.     Thoracic back: Normal.     Lumbar back: Tenderness present. No bony tenderness. Normal range of motion.  Neurological:     Mental Status: He is alert.  Psychiatric:        Behavior: Behavior is cooperative.      UC  Treatments / Results  Labs (all labs ordered are listed, but only abnormal results are displayed) Labs Reviewed - No data to display  EKG   Radiology No results found.  Procedures Procedures (including critical care time)  Medications Ordered in UC Medications  ketorolac (TORADOL) 30 MG/ML injection 30 mg (30 mg Intramuscular Given 01/12/24 1213)    Initial Impression / Assessment and Plan / UC Course  I have reviewed the triage vital signs and the nursing notes.  Pertinent labs & imaging results that were available during my care of the patient were reviewed by me and considered in my medical decision making (see chart for details).     Patient presented with 3-day history of lower back pain.  Patient reports history of low back pain.  Denies any other symptoms.  Denies any known injuries.  Upon assessment patient has muscular tenderness noted to bilateral low back.  Patient endorses increased pain with movement.  No decreased range of motion noted.  Given Toradol injection in clinic.  Prescribed Robaxin as needed for muscle pain and spasms.  Prescribed lidocaine patches to use as needed.  Discussed return precautions. Final Clinical Impressions(s) / UC Diagnoses   Final diagnoses:  Chronic bilateral low back pain without sciatica     Discharge Instructions      You were given a Toradol injection in clinic today to help with your pain.  You can take Robaxin as needed for muscle pain and spasms.  You can also apply lidocaine patches as needed.  Otherwise alternate between Tylenol and ibuprofen every 4-6 hours as needed for pain.  You can also apply heating pad to help with pain relief as well.  Return here as needed.    ED Prescriptions     Medication Sig Dispense Auth. Provider   methocarbamol (ROBAXIN) 500 MG tablet Take 1 tablet (500 mg total) by mouth 2 (two) times daily. 20 tablet Susann Givens, Efrem Pitstick A, NP   lidocaine (LIDODERM) 5 % Place 1 patch onto the skin daily.  Remove & Discard patch within 12 hours or as directed by MD 30 patch Letta Kocher, NP      PDMP not reviewed this encounter.   Wynonia Lawman A, NP 01/12/24  1241  

## 2024-01-12 NOTE — ED Triage Notes (Signed)
Patient presents to office for lower back pain . This is a recurrent issue for patient. Patient does not do any having lifting.

## 2024-01-12 NOTE — Discharge Instructions (Signed)
You were given a Toradol injection in clinic today to help with your pain.  You can take Robaxin as needed for muscle pain and spasms.  You can also apply lidocaine patches as needed.  Otherwise alternate between Tylenol and ibuprofen every 4-6 hours as needed for pain.  You can also apply heating pad to help with pain relief as well.  Return here as needed.

## 2024-10-20 ENCOUNTER — Ambulatory Visit (HOSPITAL_COMMUNITY)
